# Patient Record
Sex: Male | Born: 1945 | Race: White | Hispanic: No
Health system: Southern US, Community
[De-identification: ages and names within clinical notes are randomized; demographics above are authoritative.]

---

## 2018-03-07 DIAGNOSIS — E785 Hyperlipidemia, unspecified: Secondary | ICD-10-CM | POA: Diagnosis not present

## 2018-03-07 DIAGNOSIS — K219 Gastro-esophageal reflux disease without esophagitis: Secondary | ICD-10-CM

## 2018-03-07 DIAGNOSIS — J441 Chronic obstructive pulmonary disease with (acute) exacerbation: Secondary | ICD-10-CM | POA: Diagnosis not present

## 2018-03-07 DIAGNOSIS — J440 Chronic obstructive pulmonary disease with acute lower respiratory infection: Secondary | ICD-10-CM

## 2018-03-07 DIAGNOSIS — J9611 Chronic respiratory failure with hypoxia: Secondary | ICD-10-CM | POA: Diagnosis not present

## 2018-03-08 DIAGNOSIS — E785 Hyperlipidemia, unspecified: Secondary | ICD-10-CM | POA: Diagnosis not present

## 2018-03-08 DIAGNOSIS — K219 Gastro-esophageal reflux disease without esophagitis: Secondary | ICD-10-CM | POA: Diagnosis not present

## 2018-03-08 DIAGNOSIS — J9611 Chronic respiratory failure with hypoxia: Secondary | ICD-10-CM | POA: Diagnosis not present

## 2018-03-08 DIAGNOSIS — J441 Chronic obstructive pulmonary disease with (acute) exacerbation: Secondary | ICD-10-CM | POA: Diagnosis not present

## 2018-03-09 DIAGNOSIS — J441 Chronic obstructive pulmonary disease with (acute) exacerbation: Secondary | ICD-10-CM | POA: Diagnosis not present

## 2018-03-09 DIAGNOSIS — K219 Gastro-esophageal reflux disease without esophagitis: Secondary | ICD-10-CM | POA: Diagnosis not present

## 2018-03-09 DIAGNOSIS — E785 Hyperlipidemia, unspecified: Secondary | ICD-10-CM | POA: Diagnosis not present

## 2018-03-09 DIAGNOSIS — J9611 Chronic respiratory failure with hypoxia: Secondary | ICD-10-CM | POA: Diagnosis not present

## 2018-03-10 DIAGNOSIS — J441 Chronic obstructive pulmonary disease with (acute) exacerbation: Secondary | ICD-10-CM | POA: Diagnosis not present

## 2018-03-10 DIAGNOSIS — J9611 Chronic respiratory failure with hypoxia: Secondary | ICD-10-CM | POA: Diagnosis not present

## 2018-03-10 DIAGNOSIS — K219 Gastro-esophageal reflux disease without esophagitis: Secondary | ICD-10-CM | POA: Diagnosis not present

## 2018-03-10 DIAGNOSIS — E785 Hyperlipidemia, unspecified: Secondary | ICD-10-CM | POA: Diagnosis not present

## 2018-03-11 DIAGNOSIS — E785 Hyperlipidemia, unspecified: Secondary | ICD-10-CM | POA: Diagnosis not present

## 2018-03-11 DIAGNOSIS — K219 Gastro-esophageal reflux disease without esophagitis: Secondary | ICD-10-CM | POA: Diagnosis not present

## 2018-03-11 DIAGNOSIS — J441 Chronic obstructive pulmonary disease with (acute) exacerbation: Secondary | ICD-10-CM | POA: Diagnosis not present

## 2018-03-11 DIAGNOSIS — J9611 Chronic respiratory failure with hypoxia: Secondary | ICD-10-CM | POA: Diagnosis not present

## 2018-03-12 DIAGNOSIS — K219 Gastro-esophageal reflux disease without esophagitis: Secondary | ICD-10-CM | POA: Diagnosis not present

## 2018-03-12 DIAGNOSIS — J441 Chronic obstructive pulmonary disease with (acute) exacerbation: Secondary | ICD-10-CM | POA: Diagnosis not present

## 2018-03-12 DIAGNOSIS — E785 Hyperlipidemia, unspecified: Secondary | ICD-10-CM | POA: Diagnosis not present

## 2018-03-12 DIAGNOSIS — J9611 Chronic respiratory failure with hypoxia: Secondary | ICD-10-CM | POA: Diagnosis not present

## 2018-04-07 DIAGNOSIS — E871 Hypo-osmolality and hyponatremia: Secondary | ICD-10-CM | POA: Diagnosis not present

## 2018-04-07 DIAGNOSIS — J441 Chronic obstructive pulmonary disease with (acute) exacerbation: Secondary | ICD-10-CM

## 2018-04-07 DIAGNOSIS — R0600 Dyspnea, unspecified: Secondary | ICD-10-CM

## 2018-04-08 DIAGNOSIS — J441 Chronic obstructive pulmonary disease with (acute) exacerbation: Secondary | ICD-10-CM | POA: Diagnosis not present

## 2018-04-08 DIAGNOSIS — R0600 Dyspnea, unspecified: Secondary | ICD-10-CM | POA: Diagnosis not present

## 2018-04-08 DIAGNOSIS — E871 Hypo-osmolality and hyponatremia: Secondary | ICD-10-CM | POA: Diagnosis not present

## 2018-04-09 DIAGNOSIS — R0600 Dyspnea, unspecified: Secondary | ICD-10-CM | POA: Diagnosis not present

## 2018-04-09 DIAGNOSIS — J441 Chronic obstructive pulmonary disease with (acute) exacerbation: Secondary | ICD-10-CM | POA: Diagnosis not present

## 2018-04-09 DIAGNOSIS — E871 Hypo-osmolality and hyponatremia: Secondary | ICD-10-CM | POA: Diagnosis not present

## 2018-04-25 DIAGNOSIS — R0602 Shortness of breath: Secondary | ICD-10-CM | POA: Diagnosis not present

## 2018-04-25 DIAGNOSIS — J189 Pneumonia, unspecified organism: Secondary | ICD-10-CM | POA: Diagnosis not present

## 2018-04-25 DIAGNOSIS — I361 Nonrheumatic tricuspid (valve) insufficiency: Secondary | ICD-10-CM

## 2018-04-26 DIAGNOSIS — J189 Pneumonia, unspecified organism: Secondary | ICD-10-CM | POA: Diagnosis not present

## 2018-04-26 DIAGNOSIS — R0602 Shortness of breath: Secondary | ICD-10-CM | POA: Diagnosis not present

## 2018-05-09 ENCOUNTER — Inpatient Hospital Stay (HOSPITAL_COMMUNITY): Payer: Medicare Other

## 2018-05-09 ENCOUNTER — Inpatient Hospital Stay (HOSPITAL_COMMUNITY)
Admission: AD | Admit: 2018-05-09 | Discharge: 2018-06-03 | DRG: 207 | Disposition: E | Payer: Medicare Other | Source: Other Acute Inpatient Hospital | Attending: Pulmonary Disease | Admitting: Pulmonary Disease

## 2018-05-09 DIAGNOSIS — J9690 Respiratory failure, unspecified, unspecified whether with hypoxia or hypercapnia: Secondary | ICD-10-CM | POA: Diagnosis not present

## 2018-05-09 DIAGNOSIS — Y95 Nosocomial condition: Secondary | ICD-10-CM | POA: Diagnosis not present

## 2018-05-09 DIAGNOSIS — I248 Other forms of acute ischemic heart disease: Secondary | ICD-10-CM | POA: Diagnosis not present

## 2018-05-09 DIAGNOSIS — J9621 Acute and chronic respiratory failure with hypoxia: Secondary | ICD-10-CM | POA: Diagnosis present

## 2018-05-09 DIAGNOSIS — K668 Other specified disorders of peritoneum: Secondary | ICD-10-CM | POA: Diagnosis not present

## 2018-05-09 DIAGNOSIS — R570 Cardiogenic shock: Secondary | ICD-10-CM | POA: Diagnosis not present

## 2018-05-09 DIAGNOSIS — J439 Emphysema, unspecified: Secondary | ICD-10-CM | POA: Diagnosis not present

## 2018-05-09 DIAGNOSIS — J9312 Secondary spontaneous pneumothorax: Secondary | ICD-10-CM | POA: Diagnosis not present

## 2018-05-09 DIAGNOSIS — Z66 Do not resuscitate: Secondary | ICD-10-CM | POA: Diagnosis not present

## 2018-05-09 DIAGNOSIS — E872 Acidosis: Secondary | ICD-10-CM | POA: Diagnosis not present

## 2018-05-09 DIAGNOSIS — A419 Sepsis, unspecified organism: Secondary | ICD-10-CM | POA: Diagnosis not present

## 2018-05-09 DIAGNOSIS — J9622 Acute and chronic respiratory failure with hypercapnia: Secondary | ICD-10-CM | POA: Diagnosis present

## 2018-05-09 DIAGNOSIS — I6200 Nontraumatic subdural hemorrhage, unspecified: Secondary | ICD-10-CM | POA: Diagnosis present

## 2018-05-09 DIAGNOSIS — J189 Pneumonia, unspecified organism: Secondary | ICD-10-CM | POA: Diagnosis present

## 2018-05-09 DIAGNOSIS — R6521 Severe sepsis with septic shock: Secondary | ICD-10-CM | POA: Diagnosis not present

## 2018-05-09 DIAGNOSIS — J982 Interstitial emphysema: Secondary | ICD-10-CM | POA: Diagnosis present

## 2018-05-09 DIAGNOSIS — J930 Spontaneous tension pneumothorax: Secondary | ICD-10-CM | POA: Diagnosis present

## 2018-05-09 DIAGNOSIS — N179 Acute kidney failure, unspecified: Secondary | ICD-10-CM | POA: Diagnosis not present

## 2018-05-09 DIAGNOSIS — J9601 Acute respiratory failure with hypoxia: Secondary | ICD-10-CM

## 2018-05-09 DIAGNOSIS — I469 Cardiac arrest, cause unspecified: Secondary | ICD-10-CM | POA: Diagnosis present

## 2018-05-09 DIAGNOSIS — Z6827 Body mass index (BMI) 27.0-27.9, adult: Secondary | ICD-10-CM

## 2018-05-09 DIAGNOSIS — E875 Hyperkalemia: Secondary | ICD-10-CM | POA: Diagnosis not present

## 2018-05-09 DIAGNOSIS — J939 Pneumothorax, unspecified: Secondary | ICD-10-CM

## 2018-05-09 DIAGNOSIS — R569 Unspecified convulsions: Secondary | ICD-10-CM | POA: Diagnosis not present

## 2018-05-09 DIAGNOSIS — Z9689 Presence of other specified functional implants: Secondary | ICD-10-CM

## 2018-05-09 DIAGNOSIS — Z9981 Dependence on supplemental oxygen: Secondary | ICD-10-CM | POA: Diagnosis not present

## 2018-05-09 DIAGNOSIS — G9341 Metabolic encephalopathy: Secondary | ICD-10-CM | POA: Diagnosis not present

## 2018-05-09 DIAGNOSIS — E46 Unspecified protein-calorie malnutrition: Secondary | ICD-10-CM | POA: Diagnosis not present

## 2018-05-09 DIAGNOSIS — I2510 Atherosclerotic heart disease of native coronary artery without angina pectoris: Secondary | ICD-10-CM | POA: Diagnosis present

## 2018-05-09 DIAGNOSIS — E785 Hyperlipidemia, unspecified: Secondary | ICD-10-CM | POA: Diagnosis present

## 2018-05-09 DIAGNOSIS — I700 Atherosclerosis of aorta: Secondary | ICD-10-CM | POA: Diagnosis present

## 2018-05-09 DIAGNOSIS — J860 Pyothorax with fistula: Secondary | ICD-10-CM | POA: Diagnosis not present

## 2018-05-09 DIAGNOSIS — J9383 Other pneumothorax: Secondary | ICD-10-CM

## 2018-05-09 DIAGNOSIS — J9382 Other air leak: Secondary | ICD-10-CM | POA: Diagnosis not present

## 2018-05-09 DIAGNOSIS — J9600 Acute respiratory failure, unspecified whether with hypoxia or hypercapnia: Secondary | ICD-10-CM

## 2018-05-09 DIAGNOSIS — J449 Chronic obstructive pulmonary disease, unspecified: Secondary | ICD-10-CM | POA: Diagnosis not present

## 2018-05-09 DIAGNOSIS — J95812 Postprocedural air leak: Secondary | ICD-10-CM | POA: Diagnosis not present

## 2018-05-09 LAB — CBC WITH DIFFERENTIAL/PLATELET
BASOS ABS: 0 10*3/uL (ref 0.0–0.1)
BASOS PCT: 0 %
Eosinophils Absolute: 0 10*3/uL (ref 0.0–0.5)
Eosinophils Relative: 0 %
HCT: 35 % — ABNORMAL LOW (ref 39.0–52.0)
HEMOGLOBIN: 10.5 g/dL — AB (ref 13.0–17.0)
LYMPHS PCT: 0 %
Lymphs Abs: 0 10*3/uL — ABNORMAL LOW (ref 0.7–4.0)
MCH: 28.9 pg (ref 26.0–34.0)
MCHC: 30 g/dL (ref 30.0–36.0)
MCV: 96.4 fL (ref 80.0–100.0)
Monocytes Absolute: 0 10*3/uL — ABNORMAL LOW (ref 0.1–1.0)
Monocytes Relative: 0 %
NEUTROS ABS: 36.4 10*3/uL — AB (ref 1.7–7.7)
NEUTROS PCT: 100 %
NRBC: 0 % (ref 0.0–0.2)
PLATELETS: 313 10*3/uL (ref 150–400)
RBC: 3.63 MIL/uL — AB (ref 4.22–5.81)
RDW: 13.4 % (ref 11.5–15.5)
WBC: 36.4 10*3/uL — AB (ref 4.0–10.5)
nRBC: 0 /100 WBC

## 2018-05-09 LAB — URINALYSIS, ROUTINE W REFLEX MICROSCOPIC
BACTERIA UA: NONE SEEN
BILIRUBIN URINE: NEGATIVE
Glucose, UA: 50 mg/dL — AB
Ketones, ur: NEGATIVE mg/dL
LEUKOCYTES UA: NEGATIVE
NITRITE: NEGATIVE
PH: 5 (ref 5.0–8.0)
Protein, ur: NEGATIVE mg/dL
SPECIFIC GRAVITY, URINE: 1.027 (ref 1.005–1.030)

## 2018-05-09 LAB — PROTIME-INR
INR: 1.8
PROTHROMBIN TIME: 20.7 s — AB (ref 11.4–15.2)

## 2018-05-09 LAB — MRSA PCR SCREENING: MRSA BY PCR: NEGATIVE

## 2018-05-09 LAB — GLUCOSE, CAPILLARY
GLUCOSE-CAPILLARY: 173 mg/dL — AB (ref 70–99)
Glucose-Capillary: 233 mg/dL — ABNORMAL HIGH (ref 70–99)

## 2018-05-09 LAB — LACTIC ACID, PLASMA: LACTIC ACID, VENOUS: 2.2 mmol/L — AB (ref 0.5–1.9)

## 2018-05-09 MED ORDER — ALBUTEROL (5 MG/ML) CONTINUOUS INHALATION SOLN
7.5000 mg/h | INHALATION_SOLUTION | RESPIRATORY_TRACT | Status: AC
  Filled 2018-05-09: qty 20

## 2018-05-09 MED ORDER — FENTANYL CITRATE (PF) 100 MCG/2ML IJ SOLN: 50.0000 ug | Freq: Once | INTRAMUSCULAR | Status: DC

## 2018-05-09 MED ORDER — BUDESONIDE 0.5 MG/2ML IN SUSP: 0.5000 mg | Freq: Two times a day (BID) | RESPIRATORY_TRACT | Status: DC

## 2018-05-09 MED ORDER — DOCUSATE SODIUM 50 MG/5ML PO LIQD
100.0000 mg | Freq: Two times a day (BID) | ORAL | Status: DC | PRN
  Administered 2018-05-13: 100 mg
  Filled 2018-05-09: qty 10

## 2018-05-09 MED ORDER — BISACODYL 10 MG RE SUPP: 10.0000 mg | Freq: Every day | RECTAL | Status: DC | PRN

## 2018-05-09 MED ORDER — SODIUM CHLORIDE 0.9 % IV SOLN
1.0000 g | Freq: Three times a day (TID) | INTRAVENOUS | Status: DC
  Administered 2018-05-10 – 2018-05-11 (×4): 1 g via INTRAVENOUS
  Filled 2018-05-09 (×5): qty 1

## 2018-05-09 MED ORDER — FENTANYL BOLUS VIA INFUSION
25.0000 ug | INTRAVENOUS | Status: DC | PRN
  Administered 2018-05-10 – 2018-05-18 (×41): 25 ug via INTRAVENOUS
  Filled 2018-05-09: qty 25

## 2018-05-09 MED ORDER — SODIUM CHLORIDE 0.9 % IV SOLN
2.0000 g | Freq: Once | INTRAVENOUS | Status: AC
  Administered 2018-05-09: 2 g via INTRAVENOUS
  Filled 2018-05-09: qty 2

## 2018-05-09 MED ORDER — VANCOMYCIN HCL 10 G IV SOLR
1250.0000 mg | Freq: Once | INTRAVENOUS | Status: AC
  Administered 2018-05-09: 1250 mg via INTRAVENOUS
  Filled 2018-05-09: qty 1250

## 2018-05-09 MED ORDER — FENTANYL 2500MCG IN NS 250ML (10MCG/ML) PREMIX INFUSION
25.0000 ug/h | INTRAVENOUS | Status: DC
  Administered 2018-05-10: 300 ug/h via INTRAVENOUS
  Administered 2018-05-10: 400 ug/h via INTRAVENOUS
  Administered 2018-05-10: 100 ug/h via INTRAVENOUS
  Administered 2018-05-11: 200 ug/h via INTRAVENOUS
  Administered 2018-05-11: 400 ug/h via INTRAVENOUS
  Administered 2018-05-11: 375 ug/h via INTRAVENOUS
  Administered 2018-05-12: 250 ug/h via INTRAVENOUS
  Administered 2018-05-12: 175 ug/h via INTRAVENOUS
  Administered 2018-05-13: 200 ug/h via INTRAVENOUS
  Administered 2018-05-13 – 2018-05-14 (×2): 250 ug/h via INTRAVENOUS
  Administered 2018-05-14: 220 ug/h via INTRAVENOUS
  Administered 2018-05-15: 200 ug/h via INTRAVENOUS
  Administered 2018-05-15: 250 ug/h via INTRAVENOUS
  Administered 2018-05-16: 225 ug/h via INTRAVENOUS
  Administered 2018-05-16 (×2): 250 ug/h via INTRAVENOUS
  Administered 2018-05-17: 200 ug/h via INTRAVENOUS
  Filled 2018-05-09 (×19): qty 250

## 2018-05-09 MED ORDER — ARFORMOTEROL TARTRATE 15 MCG/2ML IN NEBU
15.0000 ug | INHALATION_SOLUTION | Freq: Two times a day (BID) | RESPIRATORY_TRACT | Status: DC
  Administered 2018-05-09 – 2018-05-17 (×16): 15 ug via RESPIRATORY_TRACT
  Filled 2018-05-09 (×16): qty 2

## 2018-05-09 MED ORDER — FAMOTIDINE IN NACL 20-0.9 MG/50ML-% IV SOLN
20.0000 mg | Freq: Two times a day (BID) | INTRAVENOUS | Status: DC
  Administered 2018-05-09 – 2018-05-17 (×16): 20 mg via INTRAVENOUS
  Filled 2018-05-09 (×16): qty 50

## 2018-05-09 MED ORDER — MIDAZOLAM HCL 2 MG/2ML IJ SOLN
1.0000 mg | INTRAMUSCULAR | Status: DC | PRN
  Administered 2018-05-10 – 2018-05-16 (×20): 1 mg via INTRAVENOUS
  Filled 2018-05-09 (×18): qty 2

## 2018-05-09 MED ORDER — VANCOMYCIN HCL IN DEXTROSE 750-5 MG/150ML-% IV SOLN
750.0000 mg | Freq: Three times a day (TID) | INTRAVENOUS | Status: DC
  Administered 2018-05-10: 750 mg via INTRAVENOUS
  Filled 2018-05-09 (×2): qty 150

## 2018-05-09 MED ORDER — MIDAZOLAM HCL 2 MG/2ML IJ SOLN
1.0000 mg | INTRAMUSCULAR | Status: AC | PRN
  Administered 2018-05-11 – 2018-05-13 (×3): 1 mg via INTRAVENOUS
  Filled 2018-05-09 (×8): qty 2

## 2018-05-09 MED ORDER — VASOPRESSIN 20 UNIT/ML IV SOLN
0.0300 [IU]/min | INTRAVENOUS | Status: DC
  Administered 2018-05-09 – 2018-05-10 (×2): 0.03 [IU]/min via INTRAVENOUS
  Filled 2018-05-09 (×3): qty 2

## 2018-05-09 MED ORDER — METRONIDAZOLE IN NACL 5-0.79 MG/ML-% IV SOLN
500.0000 mg | Freq: Three times a day (TID) | INTRAVENOUS | Status: DC
  Administered 2018-05-09 – 2018-05-14 (×15): 500 mg via INTRAVENOUS
  Filled 2018-05-09 (×15): qty 100

## 2018-05-09 MED ORDER — NOREPINEPHRINE 4 MG/250ML-% IV SOLN
0.0000 ug/min | INTRAVENOUS | Status: DC
  Administered 2018-05-09: 30 ug/min via INTRAVENOUS
  Administered 2018-05-09: 18 ug/min via INTRAVENOUS
  Administered 2018-05-10: 16 ug/min via INTRAVENOUS
  Filled 2018-05-09 (×4): qty 250

## 2018-05-09 MED ORDER — IPRATROPIUM-ALBUTEROL 0.5-2.5 (3) MG/3ML IN SOLN
3.0000 mL | RESPIRATORY_TRACT | Status: DC
  Administered 2018-05-09 – 2018-05-18 (×54): 3 mL via RESPIRATORY_TRACT
  Filled 2018-05-09 (×55): qty 3

## 2018-05-09 MED ORDER — BUDESONIDE 0.5 MG/2ML IN SUSP
0.5000 mg | Freq: Two times a day (BID) | RESPIRATORY_TRACT | Status: DC
  Administered 2018-05-09 – 2018-05-18 (×18): 0.5 mg via RESPIRATORY_TRACT
  Filled 2018-05-09 (×18): qty 2

## 2018-05-09 MED ORDER — METHYLPREDNISOLONE SODIUM SUCC 40 MG IJ SOLR
40.0000 mg | Freq: Four times a day (QID) | INTRAMUSCULAR | Status: DC
  Administered 2018-05-09 – 2018-05-12 (×11): 40 mg via INTRAVENOUS
  Filled 2018-05-09 (×12): qty 1

## 2018-05-09 MED ORDER — INSULIN ASPART 100 UNIT/ML ~~LOC~~ SOLN
0.0000 [IU] | SUBCUTANEOUS | Status: DC
  Administered 2018-05-09 – 2018-05-10 (×3): 2 [IU] via SUBCUTANEOUS
  Administered 2018-05-10: 3 [IU] via SUBCUTANEOUS
  Administered 2018-05-10: 2 [IU] via SUBCUTANEOUS
  Administered 2018-05-11: 1 [IU] via SUBCUTANEOUS
  Administered 2018-05-11: 2 [IU] via SUBCUTANEOUS
  Administered 2018-05-11 – 2018-05-12 (×3): 1 [IU] via SUBCUTANEOUS
  Administered 2018-05-12: 2 [IU] via SUBCUTANEOUS
  Administered 2018-05-12 (×2): 1 [IU] via SUBCUTANEOUS
  Administered 2018-05-13 (×3): 2 [IU] via SUBCUTANEOUS
  Administered 2018-05-13: 1 [IU] via SUBCUTANEOUS
  Administered 2018-05-13 (×2): 2 [IU] via SUBCUTANEOUS
  Administered 2018-05-13: 1 [IU] via SUBCUTANEOUS
  Administered 2018-05-14 (×3): 2 [IU] via SUBCUTANEOUS
  Administered 2018-05-14: 1 [IU] via SUBCUTANEOUS
  Administered 2018-05-14: 2 [IU] via SUBCUTANEOUS
  Administered 2018-05-15: 1 [IU] via SUBCUTANEOUS
  Administered 2018-05-15 (×2): 2 [IU] via SUBCUTANEOUS
  Administered 2018-05-15: 1 [IU] via SUBCUTANEOUS
  Administered 2018-05-15 – 2018-05-16 (×3): 2 [IU] via SUBCUTANEOUS
  Administered 2018-05-16 – 2018-05-17 (×6): 1 [IU] via SUBCUTANEOUS

## 2018-05-09 NOTE — Progress Notes (Signed)
Back from CT w/o complications 

## 2018-05-09 NOTE — H&P (Signed)
NAME:  Julian Fernandez, MRN:  [id number], DOB:  [birth year], LOS: 0 ADMISSION DATE:  2018-06-05, CONSULTATION DATE:  06/05/18 REFERRING MD:  [facility], CHIEF COMPLAINT:  Cardiac arrest   Brief History   21 yoM with PMH significant for chronic hypoxic respiratory failure on 4L O2 and COPD presented to [facility] from [place] with SOB and AMS.  Found to have left tension pneumothorax.  Had 12 min cardiac arrest with intubation and CT placement.  Pt with progressive shock and new mass pneumoperitoneum.  Recent hospitalization 2 weeks ago for AECOPD and HCAP.   Past Medical History  COPD, chronic hypoxic respiratory failure on 4L O2, HLD,  Significant Hospital Events   11/6 tx to [facility] from [place]/ cardiac arrest   Consults: date of consult/date signed off & final recs:   Procedures (surgical and bedside):  11/6 ETT >> 11/6 Left 30 f CT >> 11/6 L IJ TL >> 11/6 foley >>  Significant Diagnostic Tests:  11/6 CT chest >> Left CT in good position with residual 25% pnx.  Massive pneumoperitoneum, most likely dissected own from the pneumothorax.  There is also extensive aire in the abdominal wall muscles.  No free fluid or obvious bowel injury.  Severe emphysema.  Bilateral pleural effusions.    Micro Data:  11/6 BC x 2 >> 11/6 trach asp >>  Antimicrobials:  11/6 vanc >> 11/6 cefepime >> 11/6 Flagyl >>  Subjective:  Arrived on levophed 30 mcg, vasopressin, fentanyl 100 mcg/hr and versed at 1 mg/hr.  Informed by transport team of unequal pupils.   Objective   Temperature (!) 97.4 F (36.3 C), temperature source Oral, height 5\' 8"  (1.727 m), SpO2 94 %.    Vent Mode: PRVC FiO2 (%):  [100 %] 100 % Set Rate:  [16 bmp-20 bmp] 16 bmp Vt Set:  [410 mL] 410 mL PEEP:  [5 cmH20] 5 cmH20 Plateau Pressure:  [34 cmH20] 34 cmH20   Intake/Output Summary (Last 24 hours) at 2018-06-05 1904 Last data filed at 06-05-18 1858 Gross per 24 hour  Intake -  Output 200 ml  Net -200 ml   There were no  vitals filed for this visit.  Examination: General:  Critically ill thin male on MV HEENT: pupils R 5 / L 3 minimally reactive, ETT/ OGT, +JVD  Neuro: on fentanyl, minimal grimace/ withdrawal of of shoulders with noxius stimuli, no movement in lower extremities noted CV: RR, distant HS PULM: initially breath stacking on exam, TV taken down to 6 ml/kg, rr adjusted for ME with improvement.  Bilateral rhonchi, diminished in left, CT noted to left midax with 2-3/7 air leak and 200 ml of serosanguinous drainage on 20 cm of sxn.  Palpable SQ air to left chest and upper abd, no secretions on suctioning  OZ:HYQM, hypo BS, foley in place with clear yellow urine Extremities: cold/ mottled, no LE edema  Skin: no rashes  Officer remains at bedside.   Resolved Hospital Problem list    Assessment & Plan:  Acute on chronic hypoxic respiratory failure - multifactorial related to Left tension pneumothorax, AECOPD, +/- HCAP, cardiac arrest  Vent dyssynchrony  Pneumoperitoneum - unclear etiology- ? Tracking from ptx  - s/p left CT at OSH - question if left pneumothorax was related to bleb rupture given severe emphysema  P:  Full MV support- PRVC 6 cc/kg, monitor for auto-peeping ABG now CXR now and in AM Will repeat chest CT and abd CT to 20 cm suction PAD protocol with fentanyl and  prn versed- for vent synchrony at this time  duonebs q 4, pulmicort and brovanna nebs  Solumedrol 40mg  q 6   Cardiac Arrest presumed due to left tension pneumothorax but can not rule out other etiologies at this time Shock- septic vs cardiogenic  Elevated troponin without acute ST changes  - 12 mins prior to ROSC and was f/c at [facility], sedated for vent compliance P:  Defer TTM Continue levophed and vasopressin for MAP goal > 65 Arrived with left radial aline but is not working, will need replace Trend EKG, troponin, and lactate Check co-ox CVP q 4 Check cortisol  Recheck CMET, CBC, coags now   Acute  encephalopathy after cardiac arrest and ? New unequal pupils  P:  Stat Head CT after CXR Would ideally like to stop sedation to allow for neuro exam, however patient requires ongoing sedation for vent synchrony Hourly neuro exams   Leukocytosis with recent tx for HCAP P:  Sending BC, trach asp now Check PCT Empiric vanc, cefepime and flagyl for now Trend WBC/ fever curve   Disposition / Summary of Today's Plan 05/07/2018   BP stabilization and sedation for vent synchrony CT head and chest when able    Diet: NPO Pain/Anxiety/Delirium protocol (if indicated): fentanyl gtt, prn versed for vent synchrony VAP protocol (if indicated): yes DVT prophylaxis: SCDs only for now pending head CT  GI prophylaxis: pepcid Hyperglycemia protocol: SSI  Mobility: bed rest  Code Status: Full  Family Communication: no family at bedside.  Officer remains at bedside.   Labs   CBC: No results for input(s): WBC, NEUTROABS, HGB, HCT, MCV, PLT in the last 168 hours.  Basic Metabolic Panel: No results for input(s): NA, K, CL, CO2, GLUCOSE, BUN, CREATININE, CALCIUM, MG, PHOS in the last 168 hours. GFR: CrCl cannot be calculated (No successful lab value found.). No results for input(s): PROCALCITON, WBC, LATICACIDVEN in the last 168 hours.  Liver Function Tests: No results for input(s): AST, ALT, ALKPHOS, BILITOT, PROT, ALBUMIN in the last 168 hours. No results for input(s): LIPASE, AMYLASE in the last 168 hours. No results for input(s): AMMONIA in the last 168 hours.  ABG No results found for: PHART, PCO2ART, PO2ART, HCO3, TCO2, ACIDBASEDEF, O2SAT   Coagulation Profile: No results for input(s): INR, PROTIME in the last 168 hours.  Cardiac Enzymes: No results for input(s): CKTOTAL, CKMB, CKMBINDEX, TROPONINI in the last 168 hours.  HbA1C: No results found for: HGBA1C  CBG: No results for input(s): GLUCAP in the last 168 hours.  Admitting History of Present Illness.   5 yoM with PMH  significant for chronic hypoxic respiratory failure on 4L O2 and COPD who presented to [facility] ER from jail with acute shortness of breath and altered mental status.   Recently admitted at [facility] 2 weeks ago for hypoxic respiratory failure secondary to AECOPD and HCAP.  On arrival, found to have absent left sided breath sounds, CXR confirming Left pneumothorax.  Chest tube was placed and patient with ongoing hypotension.  Patient with cardiac arrest for ~12 mins with ROSC, when intubated.  Reported that patient was following commands post arrest.  Since has had ongoing progressive shock.  CT of chest show some residual left pneumothorax with CT in place and large pneumoperitoneum thought to be dissected down from pneumothorax.  Initial labs in ER noted for WBC 17, Hgb 14.7, K 5.1, sCr 0.7, trop 1.16.  He was given 3L NS and zosyn.  Patient transferred to [facility] for further care  by PCCM and available TCTS services if needed.  Review of Systems:   Unable  Past Medical History  He,  has no past medical history on file.   Surgical History   Unable   Social History   Social History   Socioeconomic History  . Marital status: Unknown    Spouse name: Not on file  . Number of children: Not on file  . Years of education: Not on file  . Highest education level: Not on file  Occupational History  . Not on file  Social Needs  . Financial resource strain: Not on file  . Food insecurity:    Worry: Not on file    Inability: Not on file  . Transportation needs:    Medical: Not on file    Non-medical: Not on file  Tobacco Use  . Smoking status: Not on file  Substance and Sexual Activity  . Alcohol use: Not on file  . Drug use: Not on file  . Sexual activity: Not on file  Lifestyle  . Physical activity:    Days per week: Not on file    Minutes per session: Not on file  . Stress: Not on file  Relationships  . Social connections:    Talks on phone: Not on file    Gets together:  Not on file    Attends religious service: Not on file    Active member of club or organization: Not on file    Attends meetings of clubs or organizations: Not on file    Relationship status: Not on file  . Intimate partner violence:    Fear of current or ex partner: Not on file    Emotionally abused: Not on file    Physically abused: Not on file    Forced sexual activity: Not on file  Other Topics Concern  . Not on file  Social History Narrative  . Not on file  ,     Family History   His family history is not on file.   Allergies Allergies not on file   Home Medications  Prior to Admission medications   Not on File     Critical care time: 60 mins   Posey Boyer, AGACNP-BC [facility] Pgr: [phone] or if no answer [phone] 2018-05-22, 7:04 PM

## 2018-05-09 NOTE — Procedures (Signed)
Arterial Catheter Insertion Procedure Note Julian Fernandez [id number] Mar 15, 1946  Procedure: Insertion of Arterial Catheter  Indications: Blood pressure monitoring and Frequent blood sampling  Procedure Details Consent: Unable to obtain consent because of altered level of consciousness. Time Out: Verified patient identification, verified procedure, site/side was marked, verified correct patient position, special equipment/implants available, medications/allergies/relevent history reviewed, required imaging and test results available.  Performed  Maximum sterile technique was used including antiseptics, cap, gloves, gown, hand hygiene, mask and sheet. Skin prep: Chlorhexidine; local anesthetic administered 20 gauge catheter was inserted into right femoral artery using the Seldinger technique. ULTRASOUND GUIDANCE USED: YES Evaluation Blood flow good; BP tracing good. Complications: No apparent complications.  Procedure performed under direct ultrasound guidance for real time vessel cannulation.      Rutherford Guys, [place] - C [place] [facility] Pager: [phone]  or [phone] 05/31/18, 8:11 PM

## 2018-05-09 NOTE — Progress Notes (Signed)
eLink Physician-Brief Progress Note Patient Name: Julian Fernandez DOB: 1945-09-14 MRN: [id number]   Date of Service  05-30-18  HPI/Events of Note  I WAS CALLED BY RADIOLOGIST CT HEAD SHOWS VERY SMALL 3 MM SUBDURAL HEMATOMA  eICU Interventions  RECOMMEND FOLLOWING CLINICALLY MAY CONSIDER  NEURO SURG RECS IF NEEDED STOP PLT AGENTS AND DVT PRX IF ORDERED     Intervention Category Minor Interventions: Other:  Erin Fulling 05/30/2018, 11:43 PM

## 2018-05-09 NOTE — Progress Notes (Signed)
Pharmacy Antibiotic Note  Julian Fernandez is a 72 y.o. male admitted to [facility] on May 10, 2018 with SOB and AMS.  Found to have left pneumothorax and then suffered a cardiac arrest. Patient then transferred to [facility].  Pharmacy has been consulted for vancomycin and cefepime dosing for sepsis.  He is also started on Flagyl.  Today's CMP hasn't resulted.  WBC elevated at 36.4.  Information from [facility]: 150 lbs = 68 kg Zosyn 4.5gm at 1241 SCr 0.7, CrCL 90 ml/min   Plan: Vanc 1250mg  IV x 1, then 750mg  IV Q8H Cefepime 2gm IV x 1, then 1gm IV Q8H Flagyl 500mg  IV Q8H per MD Monitor renal fxn, clinical progress, vanc trough at [facility]   Height: 5\' 8"  (172.7 cm) IBW/kg (Calculated) : 68.4  Temp (24hrs), Avg:97.4 F (36.3 C), Min:97.4 F (36.3 C), Max:97.4 F (36.3 C)  No results for input(s): WBC, CREATININE, LATICACIDVEN, VANCOTROUGH, VANCOPEAK, VANCORANDOM, GENTTROUGH, GENTPEAK, GENTRANDOM, TOBRATROUGH, TOBRAPEAK, TOBRARND, AMIKACINPEAK, AMIKACINTROU, AMIKACIN in the last 168 hours.  CrCl cannot be calculated (No successful lab value found.).    Allergies not on file   Vanc 11/6 >> Cefepime 11/6 >> Flagyl 11/6 >>  11/6 MRSA PCR - 11/6 BCx -  11/6 TA -    Lazarius Rivkin D. Laney Potash, PharmD, BCPS, BCCCP 05-10-18, 10:04 PM

## 2018-05-09 NOTE — H&P (Signed)
Attending:    Subjective: 72 year old male with COPD on oxygen brought from [facility] today with severe shortness of breath.  Found to have a pneumothorax.  Required intubation.  Shortly after but intubation had a cardiac arrest.  Chest tube was placed.  Pulmonary critical care medicine was called for admission.  He was transferred to our facility.  While waiting in the emergency room he developed worsening shock.  Objective: Vitals:   05/07/2018 1738 05/04/2018 1827 05/23/2018 1856  Temp:  (!) 97.4 F (36.3 C)   TempSrc:  Oral   SpO2:   94%  Height: 5\' 8"  (1.727 m)     Vent Mode: PRVC FiO2 (%):  [100 %] 100 % Set Rate:  [16 bmp-20 bmp] 16 bmp Vt Set:  [410 mL] 410 mL PEEP:  [5 cmH20] 5 cmH20 Plateau Pressure:  [34 cmH20] 34 cmH20  Intake/Output Summary (Last 24 hours) at 05/12/2018 1902 Last data filed at 05/05/2018 1858 Gross per 24 hour  Intake -  Output 200 ml  Net -200 ml    General:  In bed on vent HENT: NCAT ETT in place PULM: Poor air movement bilaterally B, vent supported breathing CV: RRR, no mgr GI: BS+, soft, nontender MSK: normal bulk and tone Neuro: sedated on vent Derm: no crepitance noted     CBC No results found for: WBC, RBC, HGB, HCT, PLT, MCV, MCH, MCHC, RDW, LYMPHSABS, MONOABS, EOSABS, BASOSABS  BMET No results found for: NA, K, CL, CO2, GLUCOSE, BUN, CREATININE, CALCIUM, GFRNONAA, GFRAA  CXR images personally reviewed, chest tube in place, emphysema noted, question continuous diaphragm side, cardiac silhouette within normal limits  Impression/Plan: Acute on chronic respiratory failure with hypoxemia due to COPD exacerbation now with severe air trapping: Continuous albuterol now Sedation titrated to a RA SS score of -3 Brovana twice a day Pulmicort twice a day Solu-Medrol 40 mg IV every 6  Pneumothorax on left CT chest now to evaluate for potential ongoing pneumothorax  Pneumomediastinum: Likely due to air tracking down through  diaphragm: Repeat CT abdomen now given worsening shock  Worsening shock: Suspect auto PEEP/cardiogenic shock Treat bronchospasm now Decreased respiratory rate now Permissive hypercarbia Continue levo fed and vasopressin as needed maintain mean arterial pressure greater than 65 Repeat lactic acid CBC  Encephalopathy after cardiac arrest: At this point need to maintain heavy sedation for ventilator synchrony Daily wake up assessment and holiday Not a candidate for targeted temperature management as he was reported to be following commands and is now in severe shock  Rest as per NP note  My cc time 45 minutes  Heber [place], MD [facility] [facility] Pager: [phone] Cell: [phone] After 3pm or if no response, call [phone]

## 2018-05-10 ENCOUNTER — Inpatient Hospital Stay (HOSPITAL_COMMUNITY): Payer: Medicare Other

## 2018-05-10 DIAGNOSIS — I469 Cardiac arrest, cause unspecified: Secondary | ICD-10-CM

## 2018-05-10 LAB — COMPREHENSIVE METABOLIC PANEL
ALK PHOS: 39 U/L (ref 38–126)
ALT: 87 U/L — ABNORMAL HIGH (ref 0–44)
ANION GAP: 6 (ref 5–15)
AST: 98 U/L — ABNORMAL HIGH (ref 15–41)
Albumin: 2.4 g/dL — ABNORMAL LOW (ref 3.5–5.0)
BILIRUBIN TOTAL: 0.6 mg/dL (ref 0.3–1.2)
BUN: 15 mg/dL (ref 8–23)
CALCIUM: 6.2 mg/dL — AB (ref 8.9–10.3)
CO2: 19 mmol/L — ABNORMAL LOW (ref 22–32)
Chloride: 109 mmol/L (ref 98–111)
Creatinine, Ser: 1.03 mg/dL (ref 0.61–1.24)
GFR calc Af Amer: >60 mL/min (ref >60)
Glucose, Bld: 243 mg/dL — ABNORMAL HIGH (ref 70–99)
Potassium: 5.6 mmol/L — ABNORMAL HIGH (ref 3.5–5.1)
Sodium: 134 mmol/L — ABNORMAL LOW (ref 135–145)
TOTAL PROTEIN: 4.5 g/dL — AB (ref 6.5–8.1)

## 2018-05-10 LAB — POCT I-STAT 3, ART BLOOD GAS (G3+)
ACID-BASE DEFICIT: 11 mmol/L — AB (ref 0.0–2.0)
Bicarbonate: 20.9 mmol/L (ref 20.0–28.0)
O2 Saturation: 87 %
PCO2 ART: 72.8 mmHg — AB (ref 32.0–48.0)
PO2 ART: 75 mmHg — AB (ref 83.0–108.0)
TCO2: 23 mmol/L (ref 22–32)
pH, Arterial: 7.067 — CL (ref 7.350–7.450)

## 2018-05-10 LAB — BLOOD GAS, ARTERIAL
ACID-BASE DEFICIT: 8.3 mmol/L — AB (ref 0.0–2.0)
Acid-base deficit: 7.8 mmol/L — ABNORMAL HIGH (ref 0.0–2.0)
BICARBONATE: 19.4 mmol/L — AB (ref 20.0–28.0)
Bicarbonate: 20.8 mmol/L (ref 20.0–28.0)
DRAWN BY: 44166
Drawn by: 213381
FIO2: 100
FIO2: 80
LHR: 34 {breaths}/min
O2 Saturation: 96.6 %
O2 Saturation: 97 %
PEEP: 5 cmH2O
PEEP: 5 cmH2O
Patient temperature: 98.4
Patient temperature: 100.8
RATE: 14 resp/min
VT: 410 mL
VT: 410 mL
pCO2 arterial: 73.1 mmHg (ref 32.0–48.0)
pCO2 arterial: 63.5 mmHg — ABNORMAL HIGH (ref 32.0–48.0)
pH, Arterial: 7.081 — CL (ref 7.350–7.450)
pH, Arterial: 7.12 — CL (ref 7.350–7.450)
pO2, Arterial: 100 mmHg (ref 83.0–108.0)
pO2, Arterial: 112 mmHg — ABNORMAL HIGH (ref 83.0–108.0)

## 2018-05-10 LAB — PHOSPHORUS
PHOSPHORUS: 3.2 mg/dL (ref 2.5–4.6)
PHOSPHORUS: 3.3 mg/dL (ref 2.5–4.6)

## 2018-05-10 LAB — ECHOCARDIOGRAM LIMITED
HEIGHTINCHES: 68 in
Weight: 2409.19 oz

## 2018-05-10 LAB — BASIC METABOLIC PANEL
Anion gap: 5 (ref 5–15)
Anion gap: 3 — ABNORMAL LOW (ref 5–15)
BUN: 15 mg/dL (ref 8–23)
BUN: 17 mg/dL (ref 8–23)
CALCIUM: 5.9 mg/dL — AB (ref 8.9–10.3)
CHLORIDE: 112 mmol/L — AB (ref 98–111)
CO2: 18 mmol/L — AB (ref 22–32)
CO2: 21 mmol/L — AB (ref 22–32)
CREATININE: 1.03 mg/dL (ref 0.61–1.24)
CREATININE: 1.15 mg/dL (ref 0.61–1.24)
Calcium: 6.3 mg/dL — CL (ref 8.9–10.3)
Chloride: 113 mmol/L — ABNORMAL HIGH (ref 98–111)
GFR calc Af Amer: >60 mL/min (ref >60)
GFR calc non Af Amer: >60 mL/min (ref >60)
GFR calc non Af Amer: >60 mL/min (ref >60)
GLUCOSE: 224 mg/dL — AB (ref 70–99)
GLUCOSE: 191 mg/dL — AB (ref 70–99)
Potassium: 5.6 mmol/L — ABNORMAL HIGH (ref 3.5–5.1)
Potassium: 4.9 mmol/L (ref 3.5–5.1)
Sodium: 135 mmol/L (ref 135–145)
Sodium: 137 mmol/L (ref 135–145)

## 2018-05-10 LAB — MAGNESIUM
MAGNESIUM: 1.8 mg/dL (ref 1.7–2.4)
Magnesium: 1.7 mg/dL (ref 1.7–2.4)

## 2018-05-10 LAB — CORTISOL: CORTISOL PLASMA: 33.8 ug/dL

## 2018-05-10 LAB — GLUCOSE, CAPILLARY
GLUCOSE-CAPILLARY: 110 mg/dL — AB (ref 70–99)
Glucose-Capillary: 193 mg/dL — ABNORMAL HIGH (ref 70–99)
Glucose-Capillary: 171 mg/dL — ABNORMAL HIGH (ref 70–99)
Glucose-Capillary: 162 mg/dL — ABNORMAL HIGH (ref 70–99)
Glucose-Capillary: 75 mg/dL (ref 70–99)
Glucose-Capillary: 138 mg/dL — ABNORMAL HIGH (ref 70–99)

## 2018-05-10 LAB — LACTIC ACID, PLASMA
Lactic Acid, Venous: 5.1 mmol/L (ref 0.5–1.9)
Lactic Acid, Venous: 4.2 mmol/L (ref 0.5–1.9)

## 2018-05-10 LAB — CBC
HCT: 43.1 % (ref 39.0–52.0)
Hemoglobin: 12.9 g/dL — ABNORMAL LOW (ref 13.0–17.0)
MCH: 28.8 pg (ref 26.0–34.0)
MCHC: 29.9 g/dL — ABNORMAL LOW (ref 30.0–36.0)
MCV: 96.2 fL (ref 80.0–100.0)
NRBC: 0 % (ref 0.0–0.2)
PLATELETS: 318 10*3/uL (ref 150–400)
RBC: 4.48 MIL/uL (ref 4.22–5.81)
RDW: 13.9 % (ref 11.5–15.5)
WBC: 32.5 10*3/uL — ABNORMAL HIGH (ref 4.0–10.5)

## 2018-05-10 LAB — TROPONIN I
TROPONIN I: 1.09 ng/mL — AB (ref <0.03)
Troponin I: 1.37 ng/mL (ref <0.03)

## 2018-05-10 LAB — POTASSIUM
POTASSIUM: 5.1 mmol/L (ref 3.5–5.1)
Potassium: 5 mmol/L (ref 3.5–5.1)

## 2018-05-10 LAB — PROCALCITONIN: PROCALCITONIN: 8.32 ng/mL

## 2018-05-10 LAB — OSMOLALITY, URINE: Osmolality, Ur: 477 mOsm/kg (ref 300–900)

## 2018-05-10 MED ORDER — CALCIUM GLUCONATE-NACL 2-0.675 GM/100ML-% IV SOLN
2.0000 g | Freq: Once | INTRAVENOUS | Status: AC
  Administered 2018-05-10: 2000 mg via INTRAVENOUS
  Filled 2018-05-10: qty 100

## 2018-05-10 MED ORDER — PERFLUTREN LIPID MICROSPHERE
INTRAVENOUS | Status: AC
  Filled 2018-05-10: qty 10

## 2018-05-10 MED ORDER — LACTATED RINGERS IV SOLN
INTRAVENOUS | Status: DC
  Administered 2018-05-10 – 2018-05-13 (×4): via INTRAVENOUS

## 2018-05-10 MED ORDER — LACTATED RINGERS IV SOLN: INTRAVENOUS | Status: DC

## 2018-05-10 MED ORDER — LACTATED RINGERS IV BOLUS
1000.0000 mL | Freq: Once | INTRAVENOUS | Status: AC
  Administered 2018-05-10: 1000 mL via INTRAVENOUS

## 2018-05-10 MED ORDER — CHLORHEXIDINE GLUCONATE 0.12% ORAL RINSE (MEDLINE KIT)
15.0000 mL | Freq: Two times a day (BID) | OROMUCOSAL | Status: DC
  Administered 2018-05-10 – 2018-05-18 (×15): 15 mL via OROMUCOSAL

## 2018-05-10 MED ORDER — ORAL CARE MOUTH RINSE
15.0000 mL | OROMUCOSAL | Status: DC
  Administered 2018-05-10 – 2018-05-18 (×76): 15 mL via OROMUCOSAL

## 2018-05-10 MED ORDER — VANCOMYCIN HCL IN DEXTROSE 750-5 MG/150ML-% IV SOLN
750.0000 mg | Freq: Two times a day (BID) | INTRAVENOUS | Status: DC
  Administered 2018-05-10 – 2018-05-12 (×4): 750 mg via INTRAVENOUS
  Filled 2018-05-10 (×5): qty 150

## 2018-05-10 MED ORDER — SODIUM CHLORIDE 0.9 % IV SOLN
INTRAVENOUS | Status: DC | PRN
  Administered 2018-05-10 – 2018-05-13 (×3): 250 mL via INTRAVENOUS

## 2018-05-10 MED ORDER — SODIUM CHLORIDE 0.9 % IV BOLUS: 500.0000 mL | Freq: Once | INTRAVENOUS | Status: DC

## 2018-05-10 MED ORDER — FUROSEMIDE 10 MG/ML IJ SOLN
40.0000 mg | Freq: Once | INTRAMUSCULAR | Status: AC
  Administered 2018-05-10: 40 mg via INTRAVENOUS
  Filled 2018-05-10: qty 4

## 2018-05-10 MED ORDER — LACTATED RINGERS IV BOLUS: 1000.0000 mL | Freq: Once | INTRAVENOUS | Status: DC

## 2018-05-10 MED ORDER — SODIUM CHLORIDE 0.9 % IV SOLN
INTRAVENOUS | Status: DC | PRN
  Administered 2018-05-10 – 2018-05-11 (×2): 1000 mL via INTRAVENOUS

## 2018-05-10 MED ORDER — PERFLUTREN LIPID MICROSPHERE
4.0000 mL | Freq: Once | INTRAVENOUS | Status: AC
  Administered 2018-05-10: 4 mL via INTRAVENOUS

## 2018-05-10 MED ORDER — NOREPINEPHRINE 16 MG/250ML-% IV SOLN
0.0000 ug/min | INTRAVENOUS | Status: DC
  Administered 2018-05-10: 27 ug/min via INTRAVENOUS
  Administered 2018-05-10: 16 ug/min via INTRAVENOUS
  Administered 2018-05-11: 12 ug/min via INTRAVENOUS
  Administered 2018-05-11: 2 ug/min via INTRAVENOUS
  Filled 2018-05-10 (×3): qty 250

## 2018-05-10 MED ORDER — SODIUM CHLORIDE 0.9 % IV BOLUS
1000.0000 mL | Freq: Once | INTRAVENOUS | Status: AC
  Administered 2018-05-10: 1000 mL via INTRAVENOUS

## 2018-05-10 NOTE — Progress Notes (Signed)
Unable to draw back on femoral arterial line, provider made aware.

## 2018-05-10 NOTE — Progress Notes (Signed)
  Echocardiogram 2D Echocardiogram has been performed.  Julian Fernandez 05/10/2018, 11:51 AM

## 2018-05-10 NOTE — Progress Notes (Signed)
NAME:  Julian Fernandez, MRN:  [id number], DOB:  1945/09/30, LOS: 1 ADMISSION DATE:  05-22-18, CONSULTATION DATE:  05-22-18 REFERRING MD:  [facility], CHIEF COMPLAINT:  Cardiac arrest   Brief History   72 yoM with PMH significant for chronic hypoxic respiratory failure on 4L O2 and COPD presented to [facility] from [place] with SOB and AMS.  Found to have left tension pneumothorax.  Had 12 min cardiac arrest with intubation and CT placement.  Pt with progressive shock and new mass pneumoperitoneum.  Recent hospitalization 2 weeks ago for AECOPD and HCAP.   Past Medical History  COPD, chronic hypoxic respiratory failure on 4L O2, HLD,  Significant Hospital Events   11/6 tx to [facility] from / cardiac arrest   Consults: date of consult/date signed off & final recs:   Procedures (surgical and bedside):  11/6 ETT >> 11/6 Left 30 f CT >> 11/6 L IJ TL >> 11/6 foley >>  Significant Diagnostic Tests:  11/6 CT chest >> Left CT in good position with residual 25% pnx.  Massive pneumoperitoneum, most likely dissected own from the pneumothorax.  There is also extensive aire in the abdominal wall muscles.  No free fluid or obvious bowel injury.  Severe emphysema.  Bilateral pleural effusions.    Micro Data:  11/6 BC x 2 >> 11/6 trach asp >>  Antimicrobials:  11/6 vanc >> 11/6 cefepime >> 11/6 Flagyl >>  Subjective:  Currently on vasopressor support.  Noted to have a small subdural hematoma on CT of the head. Objective   Blood pressure (!) 85/57, pulse 88, temperature 99.3 F (37.4 C), resp. rate (!) 24, height 5\' 8"  (1.727 m), weight 68.3 kg, SpO2 96 %. CVP:  [7 mmHg-31 mmHg] 8 mmHg  Vent Mode: PRVC FiO2 (%):  [80 %-100 %] 80 % Set Rate:  [14 bmp-24 bmp] 24 bmp Vt Set:  [410 mL] 410 mL PEEP:  [5 cmH20] 5 cmH20 Plateau Pressure:  [18 cmH20-34 cmH20] 18 cmH20   Intake/Output Summary (Last 24 hours) at 05/10/2018 1208 Last data filed at 05/10/2018 0900 Gross per 24 hour  Intake 2195.13  ml  Output 1275 ml  Net 920.13 ml   Filed Weights   05/10/18 0500  Weight: 68.3 kg    Examination: General: Elderly male who is handcuffed to the bed sedated and he withdraws bilaterally but does not follow commands HEENT: Tracheal tube in place Neuro: Withdraws is to be gravitate to the right side CV: Distant PULM: Chest tube without air leak, intubated on full mechanical ventilatory support WU:[id number], non-tender, bsx4 active  Extremities: warm/dry, 1+ edema  Skin: no rashes or lesions  Officer remains at bedside.   Resolved Hospital Problem list    Assessment & Plan:  Acute on chronic hypoxic respiratory failure - multifactorial related to Left tension pneumothorax, AECOPD, +/- HCAP, cardiac arrest  Vent dyssynchrony  Pneumoperitoneum - unclear etiology- ? Tracking from ptx  - s/p left CT at OSH - question if left pneumothorax was related to bleb rupture given severe emphysema  P:  Continue full mechanical dilatory support Repeat arterial blood gas X-ray shows almost complete resolution of pneumothorax CT to 20 cm suction Solu-Medrol with history of CPAP COPD  Cardiac Arrest presumed due to left tension pneumothorax but can not rule out other etiologies at this time Shock- septic vs cardiogenic  Elevated troponin without acute ST changes  - 12 mins prior to ROSC and was f/c at [facility], sedated for vent compliance P:  Pressure support  as needed Trend EKG troponin and lactate  Acute encephalopathy after cardiac arrest and ? New unequal pupils  P:  Small subdural CT of the head Consider neurosurgery consult or neurological consult  Leukocytosis with recent tx for HCAP P:  Check culture data Procalcitonin 8.32 Continue empiric vancomycin cefepime and Flagyl White count fever curve trend  Disposition / Summary of Today's Plan 05/10/18   Ensure adequate oxygenation Chest tube for pneumothorax    Diet: NPO Pain/Anxiety/Delirium protocol (if indicated): fentanyl gtt,  prn versed for vent synchrony VAP protocol (if indicated): yes DVT prophylaxis: SCDs small subdural hematoma on CT scan GI prophylaxis: pepcid Hyperglycemia protocol: SSI  Mobility: bed rest  Code Status: Full  Family Communication: no family at bedside.  Officer remains at bedside.   Labs   CBC: Recent Labs  Lab 2018-05-25 1901 05/10/18 0342  WBC 36.4* 32.5*  NEUTROABS 36.4*  --   HGB 10.5* 12.9*  HCT 35.0* 43.1  MCV 96.4 96.2  PLT 313 318    Basic Metabolic Panel: Recent Labs  Lab 05/25/18 2215 05/10/18 0342 05/10/18 0752  NA 134* 135 137  K 5.6* 5.6* 4.9  CL 109 112* 113*  CO2 19* 18* 21*  GLUCOSE 243* 224* 191*  BUN 15 15 17   CREATININE 1.03 1.03 1.15  CALCIUM 6.2* 6.3* 5.9*  MG 1.8 1.7  --   PHOS 3.2 3.3  --    GFR: Estimated Creatinine Clearance: 55.3 mL/min (by C-G formula based on SCr of 1.15 mg/dL). Recent Labs  Lab 05-25-18 1901 05-25-2018 2215 05/10/18 0342  PROCALCITON  --  8.32  --   WBC 36.4*  --  32.5*  LATICACIDVEN  --  2.2*  --     Liver Function Tests: Recent Labs  Lab 2018/05/25 2215  AST 98*  ALT 87*  ALKPHOS 39  BILITOT 0.6  PROT 4.5*  ALBUMIN 2.4*   No results for input(s): LIPASE, AMYLASE in the last 168 hours. No results for input(s): AMMONIA in the last 168 hours.  ABG    Component Value Date/Time   PHART 7.081 (LL) 05/10/2018 0335   PCO2ART 73.1 (HH) 05/10/2018 0335   PO2ART 100 05/10/2018 0335   HCO3 20.8 05/10/2018 0335   ACIDBASEDEF 7.8 (H) 05/10/2018 0335   O2SAT 96.6 05/10/2018 0335     Coagulation Profile: Recent Labs  Lab May 25, 2018 1901  INR 1.80    Cardiac Enzymes: Recent Labs  Lab 05/25/18 2215 05/10/18 0342  TROPONINI 1.09* 1.37*    HbA1C: No results found for: HGBA1C  CBG: Recent Labs  Lab 05/25/2018 2004 05-25-18 2337 05/10/18 0343 05/10/18 0759  GLUCAP 173* 233* 193* 171*    Admitting History of Present Illness.   72 yoM with PMH significant for chronic hypoxic respiratory failure  on 4L O2 and COPD who presented to [facility] ER from jail with acute shortness of breath and altered mental status.   Recently admitted at [facility] 2 weeks ago for hypoxic respiratory failure secondary to AECOPD and HCAP.  On arrival, found to have absent left sided breath sounds, CXR confirming Left pneumothorax.  Chest tube was placed and patient with ongoing hypotension.  Patient with cardiac arrest for ~12 mins with ROSC, when intubated.  Reported that patient was following commands post arrest.  Since has had ongoing progressive shock.  CT of chest show some residual left pneumothorax with CT in place and large pneumoperitoneum thought to be dissected down from pneumothorax.  Initial labs in ER noted for WBC  17, Hgb 14.7, K 5.1, sCr 0.7, trop 1.16.  He was given 3L NS and zosyn.  Patient transferred to [facility] for further care by PCCM and available TCTS services if needed.  Review of Systems:   Unable  Critical care time: 45 mins   Brett Canales Minor ACNP [facility] Pager [phone] till 1 pm If no answer page [phone] 05/10/2018, 12:08 PM   Attending note: I have seen and examined the patient. History, labs and imaging reviewed.  72 year old with chronic hypoxic respiratory failure, COPD admitted with spontaneous left pneumothorax, cardiac arrest status post intubation and chest tube placement  Blood pressure 100/61, pulse (!) 109, temperature (!) 100.6 F (38.1 C), resp. rate (!) 34, height 5\' 8"  (1.727 m), weight 68.3 kg, SpO2 96 %. Gen:      No acute distress HEENT:  EOMI, sclera anicteric Neck:     No masses; no thyromegaly Lungs:    Left chest tubes with air leak CV:         Regular rate and rhythm; no murmurs Abd:      + bowel sounds; soft, non-tender; no palpable masses, no distension Ext:    No edema; adequate peripheral perfusion Skin:      Warm and dry; no rash Neuro: Sedated, intubated  Labs reviewed, significant for ABG 7.067/73/70 5/87% BUN/creatinine  17/1.15 WBC 32.5, hemoglobin 12.9, platelets 318  Imaging CT chest abdomen pelvis 11/6 -large amount of pneumoperitoneum, subcutaneous air, left-sided pneumothorax with chest tube, severe emphysema Bilateral lower lobe consolidation, diffuse coronary atherosclerosis.  Chest x-ray 05/10/2018-ET tube in good position, stable left apical pneumothorax, patchy consolidation in mid to lower lungs I have reviewed the images personally  Assessment/Plan: 72 year old with cardiac arrest secondary to spontaneous left pneumothorax setting of severe COPD, recent admission for H CAP  Respiratory failure- Hypoxic, hypercarbic Pneumothorax, HCAP Continue chest tube to suction Increase respiratory rate to 34, reduce Isp time to 0.6 to reduce auto PEEP Continue Vanco, cefepime, Flagyl Follow cultures  Cardiac arrest, encephalopathy Continue heavy sedation to ensure vent synchrony Noted to have small subdural hematoma.  Will review with neurosurgery.  The patient is critically ill with multiple organ systems failure and requires high complexity decision making for assessment and support, frequent evaluation and titration of therapies, application of advanced monitoring technologies and extensive interpretation of multiple databases.  Critical care time - 35 mins. This represents my time independent of the NPs time taking care of the pt.  Chilton Greathouse MD [place] [facility] 05/10/2018, 3:25 PM

## 2018-05-10 NOTE — Progress Notes (Signed)
CRITICAL VALUE STICKER  CRITICAL VALUE: lactic acid 5.1 MD NOTIFIED: Mannam RESPONSE: orders given  Made provider aware no output from chest tube today.

## 2018-05-10 NOTE — Progress Notes (Signed)
Discussed am labs with [facility]Link MD significant for K+ 5.6, Ca 6.3 and more,  as well as patient's status on dual pressors. Orders received to hold fluid bolus, lasix and that repeat labs were not to be done at this time. EKG completed. Awaiting new orders perhaps Kayexalate per MD. Will continue to monitor.  Carlyon Prows RN

## 2018-05-10 NOTE — Progress Notes (Signed)
CRITICAL VALUE ALERT  Critical Value:  Calcium 6.3 Date & Time Notied: 05/10/2018 0518

## 2018-05-11 ENCOUNTER — Inpatient Hospital Stay (HOSPITAL_COMMUNITY): Payer: Medicare Other

## 2018-05-11 LAB — CBC WITH DIFFERENTIAL/PLATELET
Abs Immature Granulocytes: 0.22 10*3/uL — ABNORMAL HIGH (ref 0.00–0.07)
BASOS ABS: 0 10*3/uL (ref 0.0–0.1)
Basophils Relative: 0 %
Eosinophils Absolute: 0 10*3/uL (ref 0.0–0.5)
Eosinophils Relative: 0 %
HEMATOCRIT: 34.3 % — AB (ref 39.0–52.0)
Hemoglobin: 10.4 g/dL — ABNORMAL LOW (ref 13.0–17.0)
IMMATURE GRANULOCYTES: 1 %
LYMPHS ABS: 0.3 10*3/uL — AB (ref 0.7–4.0)
LYMPHS PCT: 1 %
MCH: 28.4 pg (ref 26.0–34.0)
MCHC: 30.3 g/dL (ref 30.0–36.0)
MCV: 93.7 fL (ref 80.0–100.0)
Monocytes Absolute: 0.9 10*3/uL (ref 0.1–1.0)
Monocytes Relative: 4 %
NRBC: 0 % (ref 0.0–0.2)
Neutro Abs: 19.7 10*3/uL — ABNORMAL HIGH (ref 1.7–7.7)
Neutrophils Relative %: 94 %
Platelets: 174 10*3/uL (ref 150–400)
RBC: 3.66 MIL/uL — ABNORMAL LOW (ref 4.22–5.81)
RDW: 14 % (ref 11.5–15.5)
WBC: 21.2 10*3/uL — ABNORMAL HIGH (ref 4.0–10.5)

## 2018-05-11 LAB — GLUCOSE, CAPILLARY
GLUCOSE-CAPILLARY: 103 mg/dL — AB (ref 70–99)
GLUCOSE-CAPILLARY: 40 mg/dL — AB (ref 70–99)
GLUCOSE-CAPILLARY: 248 mg/dL — AB (ref 70–99)
Glucose-Capillary: 125 mg/dL — ABNORMAL HIGH (ref 70–99)
Glucose-Capillary: 125 mg/dL — ABNORMAL HIGH (ref 70–99)
Glucose-Capillary: 138 mg/dL — ABNORMAL HIGH (ref 70–99)
Glucose-Capillary: 174 mg/dL — ABNORMAL HIGH (ref 70–99)
Glucose-Capillary: 73 mg/dL (ref 70–99)

## 2018-05-11 LAB — COMPREHENSIVE METABOLIC PANEL
ALT: 55 U/L — AB (ref 0–44)
AST: 52 U/L — ABNORMAL HIGH (ref 15–41)
Albumin: 2.3 g/dL — ABNORMAL LOW (ref 3.5–5.0)
Alkaline Phosphatase: 33 U/L — ABNORMAL LOW (ref 38–126)
Anion gap: 7 (ref 5–15)
BUN: 27 mg/dL — ABNORMAL HIGH (ref 8–23)
CHLORIDE: 108 mmol/L (ref 98–111)
CO2: 19 mmol/L — AB (ref 22–32)
CREATININE: 1.61 mg/dL — AB (ref 0.61–1.24)
Calcium: 7 mg/dL — ABNORMAL LOW (ref 8.9–10.3)
GFR calc Af Amer: 47 mL/min — ABNORMAL LOW (ref >60)
GFR calc non Af Amer: 41 mL/min — ABNORMAL LOW (ref >60)
Glucose, Bld: 188 mg/dL — ABNORMAL HIGH (ref 70–99)
Potassium: 4.6 mmol/L (ref 3.5–5.1)
Sodium: 134 mmol/L — ABNORMAL LOW (ref 135–145)
Total Bilirubin: 0.8 mg/dL (ref 0.3–1.2)
Total Protein: 4.4 g/dL — ABNORMAL LOW (ref 6.5–8.1)

## 2018-05-11 LAB — PHOSPHORUS
PHOSPHORUS: 2.8 mg/dL (ref 2.5–4.6)
Phosphorus: 1.7 mg/dL — ABNORMAL LOW (ref 2.5–4.6)

## 2018-05-11 LAB — POCT I-STAT 3, ART BLOOD GAS (G3+)
Acid-base deficit: 9 mmol/L — ABNORMAL HIGH (ref 0.0–2.0)
Acid-base deficit: 7 mmol/L — ABNORMAL HIGH (ref 0.0–2.0)
BICARBONATE: 20.6 mmol/L (ref 20.0–28.0)
BICARBONATE: 20.4 mmol/L (ref 20.0–28.0)
O2 SAT: 97 %
O2 Saturation: 50 %
PCO2 ART: 60.6 mmHg — AB (ref 32.0–48.0)
PCO2 ART: 47.1 mmHg (ref 32.0–48.0)
PH ART: 7.141 — AB (ref 7.350–7.450)
Patient temperature: 37.2
Patient temperature: 98
TCO2: 22 mmol/L (ref 22–32)
TCO2: 22 mmol/L (ref 22–32)
pH, Arterial: 7.244 — ABNORMAL LOW (ref 7.350–7.450)
pO2, Arterial: 36 mmHg — CL (ref 83.0–108.0)
pO2, Arterial: 105 mmHg (ref 83.0–108.0)

## 2018-05-11 LAB — MAGNESIUM
MAGNESIUM: 2.5 mg/dL — AB (ref 1.7–2.4)
Magnesium: 1.8 mg/dL (ref 1.7–2.4)

## 2018-05-11 LAB — LACTIC ACID, PLASMA
LACTIC ACID, VENOUS: 1.5 mmol/L (ref 0.5–1.9)
Lactic Acid, Venous: 2.9 mmol/L (ref 0.5–1.9)

## 2018-05-11 LAB — TROPONIN I: TROPONIN I: 1.51 ng/mL — AB (ref <0.03)

## 2018-05-11 LAB — STREP PNEUMONIAE URINARY ANTIGEN: Strep Pneumo Urinary Antigen: NEGATIVE

## 2018-05-11 MED ORDER — LACTATED RINGERS IV BOLUS
1000.0000 mL | Freq: Once | INTRAVENOUS | Status: AC
  Administered 2018-05-11: 1000 mL via INTRAVENOUS

## 2018-05-11 MED ORDER — CALCIUM GLUCONATE-NACL 1-0.675 GM/50ML-% IV SOLN
1.0000 g | Freq: Once | INTRAVENOUS | Status: AC
  Administered 2018-05-11: 1000 mg via INTRAVENOUS
  Filled 2018-05-11: qty 50

## 2018-05-11 MED ORDER — MAGNESIUM SULFATE 2 GM/50ML IV SOLN
2.0000 g | Freq: Once | INTRAVENOUS | Status: AC
  Administered 2018-05-11: 2 g via INTRAVENOUS
  Filled 2018-05-11: qty 50

## 2018-05-11 MED ORDER — VITAL AF 1.2 CAL PO LIQD
1000.0000 mL | ORAL | Status: DC
  Administered 2018-05-11 – 2018-05-17 (×6): 1000 mL

## 2018-05-11 MED ORDER — VITAL HIGH PROTEIN PO LIQD: 1000.0000 mL | ORAL | Status: DC

## 2018-05-11 MED ORDER — MIDAZOLAM HCL 2 MG/2ML IJ SOLN
2.0000 mg | Freq: Once | INTRAMUSCULAR | Status: AC
  Administered 2018-05-11: 2 mg via INTRAVENOUS

## 2018-05-11 MED ORDER — DEXTROSE 50 % IV SOLN
INTRAVENOUS | Status: AC
  Administered 2018-05-11: 50 mL
  Filled 2018-05-11: qty 50

## 2018-05-11 MED ORDER — DEXMEDETOMIDINE HCL IN NACL 400 MCG/100ML IV SOLN
0.4000 ug/kg/h | INTRAVENOUS | Status: DC
  Administered 2018-05-11: 1.2 ug/kg/h via INTRAVENOUS
  Administered 2018-05-11: 0.4 ug/kg/h via INTRAVENOUS
  Administered 2018-05-11 – 2018-05-14 (×17): 1.2 ug/kg/h via INTRAVENOUS
  Administered 2018-05-15: 1 ug/kg/h via INTRAVENOUS
  Administered 2018-05-15 – 2018-05-16 (×8): 1.2 ug/kg/h via INTRAVENOUS
  Administered 2018-05-17: 1 ug/kg/h via INTRAVENOUS
  Administered 2018-05-17 – 2018-05-18 (×5): 1.2 ug/kg/h via INTRAVENOUS
  Filled 2018-05-11 (×23): qty 100
  Filled 2018-05-11: qty 200
  Filled 2018-05-11 (×3): qty 100
  Filled 2018-05-11: qty 200
  Filled 2018-05-11 (×5): qty 100

## 2018-05-11 MED ORDER — SODIUM CHLORIDE 0.9 % IV SOLN
1.0000 g | INTRAVENOUS | Status: DC
  Administered 2018-05-12: 1 g via INTRAVENOUS
  Filled 2018-05-11: qty 1

## 2018-05-11 MED ORDER — MIDAZOLAM HCL 2 MG/2ML IJ SOLN
INTRAMUSCULAR | Status: AC
  Administered 2018-05-11: 2 mg via INTRAVENOUS
  Filled 2018-05-11: qty 2

## 2018-05-11 MED FILL — Epinephrine PF Soln Prefilled Syringe 1 MG/10ML (0.1 MG/ML): INTRAMUSCULAR | Qty: 20 | Status: AC

## 2018-05-11 MED FILL — Norepinephrine-Dextrose IV Solution 4 MG/250ML-5%: INTRAVENOUS | Qty: 250 | Status: AC

## 2018-05-11 NOTE — Progress Notes (Signed)
Patient was transported to CT & back to 3M10 without any complications.  

## 2018-05-11 NOTE — Progress Notes (Signed)
Initial Nutrition Assessment  DOCUMENTATION CODES:   Not applicable  INTERVENTION:   Tube Feeding:  Vital AF 1.2 @ 65 ml/hr Provides 117 g of protein, 1872 kcals and 1264 mL of free water   NUTRITION DIAGNOSIS:   Inadequate oral intake related to acute illness as evidenced by NPO status. GOAL:   Patient will meet greater than or equal to 90% of their needs  MONITOR:   TF tolerance, Vent status, Labs, Weight trends  REASON FOR ASSESSMENT:   Consult, Ventilator Enteral/tube feeding initiation and management  ASSESSMENT:   72 yo male presented to [facility] from [place] with SOB and AMS; pt with acute on chronic respiratory failure with left tension PTX, AE COPD . Pt also underwent 12 min cardiac arrest requiring intubation and chest tube placement. PMH includes chronic respiratory failure with COPD on 4L O2 at baseline.    Patient is currently intubated on ventilator support MV: 10.6 L/min Temp (24hrs), Avg:100 F (37.8 C), Min:98.8 F (37.1 C), Max:100.9 F (38.3 C)  [employer] at bedside, no family. Unable to obtain diet and weight history at time of visit. No previous weight encounters  OG tube to LIS, 300 mL dark output in 24 hours Chest Tube with 100 mL yesterday, 100 mL today  Labs: sodium 134, Creatinine 1.61, BUN 27, corrected calcium 8.4, albumin 2.3, CBGs 40-174 Meds: precedex, solumedrol  NUTRITION - FOCUSED PHYSICAL EXAM:    Most Recent Value  Orbital Region  No depletion  Upper Arm Region  No depletion  Thoracic and Lumbar Region  No depletion  Buccal Region  Unable to assess  Temple Region  Mild depletion  Clavicle Bone Region  Mild depletion  Clavicle and Acromion Bone Region  Mild depletion  Scapular Bone Region  Unable to assess  Dorsal Hand  Unable to assess  Patellar Region  No depletion  Anterior Thigh Region  No depletion  Posterior Calf Region  No depletion  Edema (RD Assessment)  None       Diet Order:   Diet Order         Diet NPO time specified  Diet effective now              EDUCATION NEEDS:   Not appropriate for education at this time  Skin:  Skin Assessment: Reviewed RN Assessment  Last BM:  no documented BM  Height:   Ht Readings from Last 1 Encounters:  05/31/2018 5\' 8"  (1.727 m)    Weight:   Wt Readings from Last 1 Encounters:  05/11/18 69.4 kg    Ideal Body Weight:     BMI:  Body mass index is 23.26 kg/m.  Estimated Nutritional Needs:   Kcal:  1859 kcals   Protein:  100-135 g  Fluid:  >/= 1.8 L   Romelle Starcher MS, RD, LDN, CNSC [phone] Pager  [phone] Weekend/On-Call Pager

## 2018-05-11 NOTE — Progress Notes (Addendum)
NAME:  Julian Fernandez, MRN:  [id number], DOB:  05-Sep-1945, LOS: 2 ADMISSION DATE:  05/23/2018, CONSULTATION DATE:  05/08/2018 REFERRING MD:  [facility], CHIEF COMPLAINT:  Cardiac arrest   Brief History   72 yo M with PMH significant for chronic hypoxic respiratory failure on 4L O2 and COPD presented to [place] from [place] with SOB and AMS.  Found to have left tension pneumothorax.  Had 12 min cardiac arrest with intubation and CT placement.  Pt with progressive shock and new mass pneumoperitoneum.  Recent hospitalization 2 weeks ago for AECOPD and HCAP.   Past Medical History  COPD, chronic hypoxic respiratory failure   Significant Hospital Events   11/6 tx to [facility] from [place]/ cardiac arrest   Consults: date of consult/date signed off & final recs:   Procedures (surgical and bedside):  11/6 ETT >> 11/6 Left 30 f CT >> 11/6 L IJ TL >> 11/6 foley >>  Significant Diagnostic Tests:  11/6 CT Chest >> Left CT in good position with residual 25% pnx.  Massive pneumoperitoneum, most likely dissected own from the pneumothorax.  There is also extensive aire in the abdominal wall muscles.  No free fluid or obvious bowel injury.  Severe emphysema.  Bilateral pleural effusions.  CT Head 11/6 >> 3 mm thickness posterior left convexity subdural hematoma. No midline shift or other mass effect.  Echo 11/7>>Completely unable to visualize heart, even with attempt at using   echo contrast (definity). This may be due to air interface, such   as with pneumothorax or severe emphysema. Unable to comment on   any cardiac structures.  CXR 11/8>> Stable persistent hazy opacification over the right mid to lower lungs and stable left retrocardiac opacification. Emphysematous disease. Left-sided chest tube unchanged. No definite left pneumothorax visualized.   Micro Data:  11/6 BC x 2 >> 11/8 trach asp >> 11/8 Urine Culture , Strep/ legionella >>   Antimicrobials:  11/6 vanc >> 11/6 cefepime >> 11/6  Flagyl >>  Subjective:  Currently on vasopressor support with Levo and Vasopressin .  Noted to have a small subdural hematoma on CT of the head. Developed leak around chest tube site 11/8, sutured x 2 areas per PCCM APP.  Objective   Blood pressure 135/75, pulse (!) 103, temperature 99.5 F (37.5 C), resp. rate (!) 34, height 5\' 8"  (1.727 m), weight 69.4 kg, SpO2 100 %. CVP:  [6 mmHg-89 mmHg] 13 mmHg  Vent Mode: PRVC FiO2 (%):  [70 %-80 %] 70 % Set Rate:  [34 bmp] 34 bmp Vt Set:  [410 mL] 410 mL PEEP:  [5 cmH20] 5 cmH20 Plateau Pressure:  [15 cmH20-21 cmH20] 15 cmH20   Intake/Output Summary (Last 24 hours) at 05/11/2018 0924 Last data filed at 05/11/2018 0900 Gross per 24 hour  Intake 5380.98 ml  Output 1350 ml  Net 4030.98 ml   Filed Weights   05/10/18 0500 05/11/18 0500  Weight: 68.3 kg 69.4 kg    Examination: General: Elderly male who is sedated and intubated handcuffed to the bed and he withdraws bilaterally but does not follow commands HEENT: Tracheal tube in place, OG tube Neuro: Withdraws , gravitates to the right side, Follow no commands, MAE x 4 CV: Distant, S1, S2, RRR, No RMG PULM: Chest tube 2/5  air leak, intubated on full mechanical ventilatory support WU:[id number], non-tender, bsx4 active  Extremities: warm/dry, 1+ edema  Skin: no rashes or lesions, pale  Officer remains at bedside.   Resolved Hospital Problem list  Assessment & Plan:  Acute on chronic hypoxic respiratory failure - multifactorial related to Left tension pneumothorax, AECOPD, +/- HCAP, cardiac arrest  Vent dyssynchrony  Pneumoperitoneum - unclear etiology- ? 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 - CO2 down trending per ABG P:  Continue full mechanical dilatory support Monitor for auto PEEP Trend  arterial blood gas X-ray  shows almost complete resolution of pneumothorax Continue CT to 20 cm suction Solu-Medrol with history of CPAP COPD Saturation goals are 88-94% CXR daily Add Precedex Change ceiling fentanyl to 250 mcg RASS goal -2 Consider TCTS consult  Cardiac Arrest presumed due to left tension pneumothorax but can not rule out other etiologies at this time Shock- septic vs cardiogenic  Elevated troponin without acute ST changes  - 12 mins prior to ROSC and was f/c at [facility], sedated for vent compliance Remains on norepi at 16 mcg and vasopressin at 0.03 Lactate down trending NSR per tele P:  Wean pressors as able Map Goal is >65 Trend  troponin and lactate EKG prn  ID CXR with opacification No Sputum or Urine Cx PCT 11/6>> 8.32 WBC 21.2 T Max 100.9 Plan Collect Sputum and Urine for Cx Follow micro/ blood Continue ABX as above Tend PCT Trend WBC and Fever curve   Acute encephalopathy after cardiac arrest and ? New unequal pupils  Small Sub Dural on CT head  P:  Small subdural CT of the head No seizure activity Requires heavy sedation Plan: Consider neurosurgery consult or neurological consult Neuro checks per routine CT Head to evaluate sub dural 11/8   Renal Hyperkalemia>> resolved Hypocalcemia Creatinine up trending Plan Monitor BMET Monitor UO Trend and replete electrolytes as needed Avoid nephro toxic medications Replete calcium  Endocrine Plan CBG Q 4 SSI  Nutrition Plan Initiate TF   Disposition / Summary of Today's Plan 05/11/18   Worsening air leak per L CT Start TF Add Precedex for agitation Culture sputum and urine     Diet: Initiate TF Pain/Anxiety/Delirium protocol (if indicated): fentanyl gtt, prn versed for vent synchrony, will add precedex VAP protocol (if indicated): yes DVT prophylaxis: SCDs small subdural hematoma on CT scan GI prophylaxis: pepcid Hyperglycemia protocol: SSI  Mobility: bed rest  Code Status: Full  Family  Communication: no family at bedside.  Officer remains at bedside.   Labs   CBC: Recent Labs  Lab 05/17/2018 1901 05/10/18 0342 05/11/18 0403  WBC 36.4* 32.5* 21.2*  NEUTROABS 36.4*  --  19.7*  HGB 10.5* 12.9* 10.4*  HCT 35.0* 43.1 34.3*  MCV 96.4 96.2 93.7  PLT 313 318 174    Basic Metabolic Panel: Recent Labs  Lab 05/06/2018 2215 05/10/18 0342 05/10/18 0752 05/10/18 1510 05/10/18 1755 05/11/18 0403  NA 134* 135 137  --   --  134*  K 5.6* 5.6* 4.9 5.0 5.1 4.6  CL 109 112* 113*  --   --  108  CO2 19* 18* 21*  --   --  19*  GLUCOSE 243* 224* 191*  --   --  188*  BUN 15 15 17   --   --  27*  CREATININE 1.03 1.03 1.15  --   --  1.61*  CALCIUM 6.2* 6.3* 5.9*  --   --  7.0*  MG 1.8 1.7  --   --   --  1.8  PHOS 3.2 3.3  --   --   --  2.8   GFR: Estimated Creatinine Clearance: 39.5 mL/min (A) (by C-G formula based on SCr of 1.61 mg/dL (H)). Recent Labs  Lab 05/24/2018 1901 05/27/2018 2215 05/10/18 0342 05/10/18 1510 05/10/18 2033 05/11/18 0403  PROCALCITON  --  8.32  --   --   --   --   WBC 36.4*  --  32.5*  --   --  21.2*  LATICACIDVEN  --  2.2*  --  5.1* 4.2* 2.9*    Liver Function Tests: Recent Labs  Lab 05/15/2018 2215 05/11/18 0403  AST 98* 52*  ALT 87* 55*  ALKPHOS 39 33*  BILITOT 0.6 0.8  PROT 4.5* 4.4*  ALBUMIN 2.4* 2.3*   No results for input(s): LIPASE, AMYLASE in the last 168 hours. No results for input(s): AMMONIA in the last 168 hours.  ABG    Component Value Date/Time   PHART 7.244 (L) 05/11/2018 0534   PCO2ART 47.1 05/11/2018 0534   PO2ART 105.0 05/11/2018 0534   HCO3 20.4 05/11/2018 0534   TCO2 22 05/11/2018 0534   ACIDBASEDEF 7.0 (H) 05/11/2018 0534   O2SAT 97.0 05/11/2018 0534     Coagulation Profile: Recent Labs  Lab 05/31/2018 1901  INR 1.80    Cardiac Enzymes: Recent Labs  Lab 05/08/2018 2215 05/10/18 0342 05/11/18 0403  TROPONINI 1.09* 1.37* 1.51*    HbA1C: No results found for: HGBA1C  CBG: Recent Labs  Lab  05/10/18 2157 05/11/18 0016 05/11/18 0400 05/11/18 0739 05/11/18 0813  GLUCAP 138* 138* 174* 73 125*    Admitting History of Present Illness.   40 yoM with PMH significant for chronic hypoxic respiratory failure on 4L O2 and COPD who [facility] with acute shortness of breath and altered mental status.   Recently admitted at [facility] 2 weeks ago for hypoxic respiratory failure secondary to AECOPD and HCAP.  On arrival, found to have absent left sided breath sounds, CXR confirming Left pneumothorax.  Chest tube was placed and patient with ongoing hypotension.  Patient with cardiac arrest for ~12 mins with ROSC, when intubated.  Reported that patient was following commands post arrest.  Since has had ongoing progressive shock.  CT of chest show some residual left pneumothorax with CT in place and large pneumoperitoneum thought to be dissected down from pneumothorax.  Initial labs in ER noted for WBC 17, Hgb 14.7, K 5.1, sCr 0.7, trop 1.16.  He was given 3L NS and zosyn.  Patient transferred to [facility] for further care by PCCM and available TCTS services if needed.    Critical care time: 45 mins     Bevelyn Ngo, AGACNP-BC [facility] Pulmonary/Critical Care Medicine Pager # [phone] Pager after 3 pm [phone] 05/11/2018, 9:24 AM

## 2018-05-11 NOTE — Progress Notes (Signed)
E link MD notified of new air leak in chest tube and new sq air. See new orders.

## 2018-05-11 NOTE — Progress Notes (Signed)
PCCM INTERVAL PROGRESS NOTE   Called to evaluate patient for new air leak at chest tube site and new 2/7 leak on pleuravac device. Upon my arrival there is air audibly coming out around the chest tube. No sutures placed incision. I placed 2 sutures, one on either side of tube to close incision. The audible air rush has resolved, however, 2/5 pleuravac leak persists. Also there is a difference between inspired and expired volumes on vent. The patient is not air trapping and airway pressures are not high. CXR shows tube in place as it was on prior imaging. PTX is stable.   Plan: Sutures placed.  Repeat CXR in the morning.   Joneen Roach, AGACNP-BC [facility] Pulmonary/Critical Care Pager [phone] or [phone]  05/11/2018 1:56 AM

## 2018-05-12 ENCOUNTER — Inpatient Hospital Stay (HOSPITAL_COMMUNITY): Payer: Medicare Other

## 2018-05-12 DIAGNOSIS — J95812 Postprocedural air leak: Secondary | ICD-10-CM

## 2018-05-12 LAB — COMPREHENSIVE METABOLIC PANEL
ALK PHOS: 30 U/L — AB (ref 38–126)
ALT: 43 U/L (ref 0–44)
AST: 45 U/L — AB (ref 15–41)
Albumin: 2.1 g/dL — ABNORMAL LOW (ref 3.5–5.0)
Anion gap: 5 (ref 5–15)
BILIRUBIN TOTAL: 0.5 mg/dL (ref 0.3–1.2)
BUN: 28 mg/dL — ABNORMAL HIGH (ref 8–23)
CALCIUM: 6.9 mg/dL — AB (ref 8.9–10.3)
CO2: 22 mmol/L (ref 22–32)
CREATININE: 1.06 mg/dL (ref 0.61–1.24)
Chloride: 108 mmol/L (ref 98–111)
GFR calc Af Amer: >60 mL/min (ref >60)
Glucose, Bld: 161 mg/dL — ABNORMAL HIGH (ref 70–99)
Potassium: 3.5 mmol/L (ref 3.5–5.1)
Sodium: 135 mmol/L (ref 135–145)
TOTAL PROTEIN: 4 g/dL — AB (ref 6.5–8.1)

## 2018-05-12 LAB — POCT I-STAT 3, ART BLOOD GAS (G3+)
Acid-base deficit: 5 mmol/L — ABNORMAL HIGH (ref 0.0–2.0)
Bicarbonate: 20 mmol/L (ref 20.0–28.0)
O2 Saturation: 96 %
PCO2 ART: 36.3 mmHg (ref 32.0–48.0)
PH ART: 7.349 — AB (ref 7.350–7.450)
PO2 ART: 89 mmHg (ref 83.0–108.0)
Patient temperature: 98.6
TCO2: 21 mmol/L — ABNORMAL LOW (ref 22–32)

## 2018-05-12 LAB — CBC
HEMATOCRIT: 30.9 % — AB (ref 39.0–52.0)
HEMOGLOBIN: 9.5 g/dL — AB (ref 13.0–17.0)
MCH: 28.1 pg (ref 26.0–34.0)
MCHC: 30.7 g/dL (ref 30.0–36.0)
MCV: 91.4 fL (ref 80.0–100.0)
NRBC: 0 % (ref 0.0–0.2)
Platelets: 120 10*3/uL — ABNORMAL LOW (ref 150–400)
RBC: 3.38 MIL/uL — ABNORMAL LOW (ref 4.22–5.81)
RDW: 13.9 % (ref 11.5–15.5)
WBC: 8.5 10*3/uL (ref 4.0–10.5)

## 2018-05-12 LAB — PROCALCITONIN: PROCALCITONIN: 3 ng/mL

## 2018-05-12 LAB — MAGNESIUM
Magnesium: 2.2 mg/dL (ref 1.7–2.4)
Magnesium: 2.2 mg/dL (ref 1.7–2.4)

## 2018-05-12 LAB — GLUCOSE, CAPILLARY
GLUCOSE-CAPILLARY: 138 mg/dL — AB (ref 70–99)
GLUCOSE-CAPILLARY: 187 mg/dL — AB (ref 70–99)
GLUCOSE-CAPILLARY: 113 mg/dL — AB (ref 70–99)
GLUCOSE-CAPILLARY: 132 mg/dL — AB (ref 70–99)
Glucose-Capillary: 117 mg/dL — ABNORMAL HIGH (ref 70–99)
Glucose-Capillary: 133 mg/dL — ABNORMAL HIGH (ref 70–99)

## 2018-05-12 LAB — TROPONIN I: TROPONIN I: 0.56 ng/mL — AB (ref <0.03)

## 2018-05-12 LAB — URINE CULTURE
CULTURE: NO GROWTH
Special Requests: NORMAL

## 2018-05-12 LAB — PHOSPHORUS
Phosphorus: 1.5 mg/dL — ABNORMAL LOW (ref 2.5–4.6)
Phosphorus: 5 mg/dL — ABNORMAL HIGH (ref 2.5–4.6)

## 2018-05-12 LAB — VANCOMYCIN, TROUGH: Vancomycin Tr: 15 ug/mL (ref 15–20)

## 2018-05-12 MED ORDER — POTASSIUM PHOSPHATES 15 MMOLE/5ML IV SOLN
20.0000 mmol | Freq: Once | INTRAVENOUS | Status: AC
  Administered 2018-05-12: 20 mmol via INTRAVENOUS
  Filled 2018-05-12: qty 6.67

## 2018-05-12 MED ORDER — METHYLPREDNISOLONE SODIUM SUCC 40 MG IJ SOLR
40.0000 mg | Freq: Two times a day (BID) | INTRAMUSCULAR | Status: DC
  Administered 2018-05-12 – 2018-05-14 (×4): 40 mg via INTRAVENOUS
  Filled 2018-05-12 (×4): qty 1

## 2018-05-12 MED ORDER — SODIUM CHLORIDE 0.9 % IV SOLN
1.0000 g | Freq: Three times a day (TID) | INTRAVENOUS | Status: AC
  Administered 2018-05-12 – 2018-05-14 (×8): 1 g via INTRAVENOUS
  Filled 2018-05-12 (×8): qty 1

## 2018-05-12 NOTE — Progress Notes (Signed)
Pharmacy Antibiotic Note  Julian Fernandez is a 72 y.o. male admitted to [facility] on 2018-05-24 with SOB and AMS. Found to have left pneumothorax and then suffered a cardiac arrest. Patient then transferred to [facility]. Pharmacy has been consulted for cefepime dosing for sepsis. Pt is afebrile and WBC is WNL. SCr improved today.   Plan: Change cefepime to 1gm IV Q8H F/u renal fxn, C&S, clinical status and LOT  Height: 5\' 8"  (172.7 cm) Weight: 168 lb 3.4 oz (76.3 kg) IBW/kg (Calculated) : 68.4  Temp (24hrs), Avg:98.1 F (36.7 C), Min:96.4 F (35.8 C), Max:100 F (37.8 C)  Recent Labs  Lab 05-24-2018 1901 24-May-2018 2215 05/10/18 0342 05/10/18 0752 05/10/18 1510 05/10/18 2033 05/11/18 0403 05/11/18 1537 05/12/18 0322  WBC 36.4*  --  32.5*  --   --   --  21.2*  --  8.5  CREATININE  --  1.03 1.03 1.15  --   --  1.61*  --  1.06  LATICACIDVEN  --  2.2*  --   --  5.1* 4.2* 2.9* 1.5  --   VANCOTROUGH  --   --   --   --   --   --   --   --  15    Estimated Creatinine Clearance: 60 mL/min (by C-G formula based on SCr of 1.06 mg/dL).    No Known Allergies  Vanc 11/6 >>11/9 Cefepime 11/6 >> Flagyl 11/6 >>  11/6 MRSA PCR - neg 11/6 BCx - ngtd 11/8 Urine - NEG  Lysle Pearl, PharmD, BCPS Please see AMION for all pharmacy numbers 05/12/2018 2:52 PM

## 2018-05-12 NOTE — Progress Notes (Signed)
NAME:  Julian Fernandez, MRN:  [id number], DOB:  16-Jul-1945, LOS: 3 ADMISSION DATE:  05/21/2018, CONSULTATION DATE:  05/17/2018 REFERRING MD:  [facility], CHIEF COMPLAINT:  Cardiac arrest   Brief History   72 yo M with PMH significant for chronic hypoxic respiratory failure on 4L O2 and COPD presented to [place] from [place] with SOB and AMS.  Found to have left tension pneumothorax.  Had 12 min cardiac arrest with intubation and CT placement.  Pt with progressive shock and new mass pneumoperitoneum.  Recent hospitalization 2 weeks ago for AECOPD and HCAP.   Past Medical History  COPD, chronic hypoxic respiratory failure   Significant Hospital Events   11/6 tx to [facility] from [place]/ cardiac arrest   Consults: date of consult/date signed off & final recs:  CVTS 11/9>   Procedures (surgical and bedside):  11/6 ETT >> 11/6 Left 30 f CT >> 11/6 L IJ TL >> 11/6 foley >>  Significant Diagnostic Tests:  11/6 CT Chest >> Left CT in good position with residual 25% pnx.  Massive pneumoperitoneum, most likely dissected own from the pneumothorax.  There is also extensive aire in the abdominal wall muscles.  No free fluid or obvious bowel injury.  Severe emphysema.  Bilateral pleural effusions.  CT Head 11/6 >> 3 mm thickness posterior left convexity subdural hematoma. No midline shift or other mass effect.  Echo 11/7>>Completely unable to visualize heart, even with attempt at using   echo contrast (definity). This may be due to air interface, such   as with pneumothorax or severe emphysema. Unable to comment on   any cardiac structures.   Micro Data:  11/6 BC x 2 >> 11/8 trach asp >> 11/8 Urine Culture , Strep/ legionella >>   Antimicrobials:  11/6 vanc >> 11/9 11/6 cefepime >> 11/6 Flagyl >>  Subjective:  Off pressors, continues on the vent, unresponsive Continues to have a large air leak in the chest tube.   Objective   Blood pressure (!) 148/124, pulse 98, temperature 99.9 F (37.7  C), resp. rate 18, height 5\' 8"  (1.727 m), weight 76.3 kg, SpO2 96 %. CVP:  [10 mmHg-28 mmHg] 28 mmHg  Vent Mode: CPAP;PSV FiO2 (%):  [40 %-50 %] 40 % Set Rate:  [34 bmp] 34 bmp Vt Set:  [410 mL] 410 mL PEEP:  [5 cmH20] 5 cmH20 Pressure Support:  [8 cmH20] 8 cmH20 Plateau Pressure:  [14 cmH20-29 cmH20] 29 cmH20   Intake/Output Summary (Last 24 hours) at 05/12/2018 1159 Last data filed at 05/12/2018 1146 Gross per 24 hour  Intake 5052.38 ml  Output 1655 ml  Net 3397.38 ml   Filed Weights   05/10/18 0500 05/11/18 0500 05/12/18 0500  Weight: 68.3 kg 69.4 kg 76.3 kg    Examination: Gen:      No acute distress HEENT:  EOMI, sclera anicteric Neck:     No masses; no thyromegaly Lungs:    Clear to auscultation bilaterally; normal respiratory effort, chest tube with large air leak. CV:         Regular rate and rhythm; no murmurs Abd:      + bowel sounds; soft, non-tender; no palpable masses, no distension Ext:    No edema; adequate peripheral perfusion Skin:      Warm and dry; no rash Neuro: Sedated, does not follow commands.  Resolved Hospital Problem list    Assessment & Plan:  Acute on chronic hypoxic respiratory failure - multifactorial related to Left tension pneumothorax, AECOPD, +/-  HCAP, cardiac arrest  Vent dyssynchrony  Pneumoperitoneum - unclear etiology- ? 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  ID CXR with opacification No Sputum or Urine Cx PCT 11/6>> 8.32 WBC 21.2 T Max 100.9 Plan Follow cultures Stop Vanco Continue cefepime and Flagyl for now.  Acute encephalopathy after cardiac arrest and ? New unequal pupils  Small Sub Dural on CT head  P:  Small subdural CT of the head > stable on repeat ? Seizure activitu Requires heavy sedation Plan: Monitor subdural.  No interventions per neurosurgery Get MRI head to assess anoxic injury.   AKI, hypophosphatemia Monitor renal function Replete potassium.  Endocrine Plan CBG Q 4 SSI  Nutrition Plan Continue tube feeds   Disposition / Summary of Today's Plan 05/12/18   MRI to assess neuro status CVTS consulted for [place]    Diet: Initiate TF Pain/Anxiety/Delirium protocol (if indicated): fentanyl gtt, prn versed for vent synchrony, will add precedex VAP protocol (if indicated): yes DVT prophylaxis: SCDs small subdural hematoma on CT scan GI prophylaxis: pepcid Hyperglycemia protocol: SSI  Mobility: bed rest  Code Status: Full  Family Communication: no family at bedside.  Officer remains at bedside.   Labs   CBC: Recent Labs  Lab 05/31/18 1901 05/10/18 0342 05/11/18 0403 05/12/18 0322  WBC 36.4* 32.5* 21.2* 8.5  NEUTROABS 36.4*  --  19.7*  --   HGB 10.5* 12.9* 10.4* 9.5*  HCT 35.0* 43.1 34.3* 30.9*  MCV 96.4 96.2 93.7 91.4  PLT 313 318 174 120*    Basic Metabolic Panel: Recent Labs  Lab 2018/05/31 2215 05/10/18 0342 05/10/18 0752 05/10/18 1510 05/10/18 1755 05/11/18 0403 05/11/18 1744 05/12/18 0322  NA 134* 135 137  --   --  134*  --  135  K 5.6* 5.6* 4.9 5.0 5.1 4.6  --  3.5  CL 109 112* 113*  --   --  108  --  108  CO2 19* 18* 21*  --   --  19*  --  22  GLUCOSE 243* 224* 191*  --   --  188*  --  161*  BUN 15 15 17   --   --  27*  --  28*  CREATININE 1.03 1.03 1.15  --   --  1.61*  --  1.06  CALCIUM 6.2* 6.3* 5.9*  --   --  7.0*  --  6.9*  MG 1.8  1.7  --   --   --  1.8 2.5* 2.2  PHOS 3.2 3.3  --   --   --  2.8 1.7* 1.5*   GFR: Estimated Creatinine Clearance: 60 mL/min (by C-G formula based on SCr of 1.06 mg/dL). Recent Labs  Lab 05-31-18 1901  May 31, 2018 2215 05/10/18 0342 05/10/18 1510 05/10/18 2033 05/11/18 0403 05/11/18 1537 05/12/18 0322  PROCALCITON  --   --  8.32  --   --   --   --   --  3.00  WBC 36.4*  --   --  32.5*  --   --  21.2*  --  8.5  LATICACIDVEN  --    < > 2.2*  --  5.1* 4.2* 2.9* 1.5  --    < > = values in this interval not displayed.    Liver Function Tests: Recent Labs  Lab May 20, 2018 2215 05/11/18 0403 05/12/18 0322  AST 98* 52* 45*  ALT 87* 55* 43  ALKPHOS 39 33* 30*  BILITOT 0.6 0.8 0.5  PROT 4.5* 4.4* 4.0*  ALBUMIN 2.4* 2.3* 2.1*   No results for input(s): LIPASE, AMYLASE in the last 168 hours. No results for input(s): AMMONIA in the last 168 hours.  ABG    Component Value Date/Time   PHART 7.349 (L) 05/12/2018 0432   PCO2ART 36.3 05/12/2018 0432   PO2ART 89.0 05/12/2018 0432   HCO3 20.0 05/12/2018 0432   TCO2 21 (L) 05/12/2018 0432   ACIDBASEDEF 5.0 (H) 05/12/2018 0432   O2SAT 96.0 05/12/2018 0432     Coagulation Profile: Recent Labs  Lab 05-20-2018 1901  INR 1.80    Cardiac Enzymes: Recent Labs  Lab 05/20/2018 2215 05/10/18 0342 05/11/18 0403  TROPONINI 1.09* 1.37* 1.51*    HbA1C: No results found for: HGBA1C  CBG: Recent Labs  Lab 05/11/18 1946 05/12/18 0027 05/12/18 0320 05/12/18 0743 05/12/18 1136  GLUCAP 125* 117* 138* 187* 113*    The patient is critically ill with multiple organ system failure and requires high complexity decision making for assessment and support, frequent evaluation and titration of therapies, advanced monitoring, review of radiographic studies and interpretation of complex data.   Critical Care Time devoted to patient care services, exclusive of separately billable procedures, described in this note is 35  minutes.   Chilton Greathouse  MD [place] Pulmonary and Critical Care Pager [phone] If no answer or after 3pm call: [phone] 05/12/2018, 11:59 AM

## 2018-05-12 NOTE — Consult Note (Signed)
Reason for Consult:Air leak Referring Physician: Dr. Nash Mantis Julian Fernandez is an 72 y.o. male.  HPI:Julian Fernandez is a 72 yo man with a history of tobacco abuse and COPD with chronic hypoxic respiratory failure on 4L [place] baseline. Presented to [facility] ED on 11/6 with SOB and AMS. Found to have a left tension pneumothorax. Chest tube placed. Had a cardiac arrest requiring 12 minutes of resuscitation. Intubated. Transferred to [facility]. Was in shock on arrival. Has had positive troponin of 1.51. ABG initially showed respiratory acidosis and hypercarbia- have improved with time.  CT chest on 11/6 showed some residual pneumothorax. Severe end stage emphysema. Required vasopressors. He has been treated with broad spectrum antibiotics.  PMH Tobacco abuse COPD Pneumonia  Family history- [place]  Social History:  has no tobacco, alcohol, and drug history on file.  Allergies: No Known Allergies  Medications:  Scheduled: . arformoterol  15 mcg Nebulization BID  . budesonide (PULMICORT) nebulizer solution  0.5 mg Nebulization BID  . chlorhexidine gluconate (MEDLINE KIT)  15 mL Mouth Rinse BID  . insulin aspart  0-9 Units Subcutaneous Q4H  . ipratropium-albuterol  3 mL Nebulization Q4H  . mouth rinse  15 mL Mouth Rinse 10 times per day  . methylPREDNISolone (SOLU-MEDROL) injection  40 mg Intravenous Q12H    Results for orders placed or performed during the hospital encounter of 05/24/2018 (from the past 48 hour(s))  Potassium     Status: None   Collection Time: 05/10/18  5:55 PM  Result Value Ref Range   Potassium 5.1 3.5 - 5.1 mmol/L    Comment: Performed at [facility], [place] [address] [place],  [address]  Glucose, capillary     Status: None   Collection Time: 05/10/18  8:31 PM  Result Value Ref Range   Glucose-Capillary 75 70 - 99 mg/dL  Lactic acid, plasma     Status: Abnormal   Collection Time: 05/10/18  8:33 PM  Result Value Ref Range   Lactic Acid, Venous 4.2 (HH) 0.5 - 1.9  mmol/L    Comment: CRITICAL RESULT CALLED TO, READ BACK BY AND VERIFIED WITH: CUMMINGS B,RN 05/10/18 2159 WAYK Performed at [facility], [place] [address], [place], [place] [address]   Glucose, capillary     Status: Abnormal   Collection Time: 05/10/18  9:57 PM  Result Value Ref Range   Glucose-Capillary 138 (H) 70 - 99 mg/dL  Glucose, capillary     Status: Abnormal   Collection Time: 05/11/18 12:16 AM  Result Value Ref Range   Glucose-Capillary 138 (H) 70 - 99 mg/dL  Glucose, capillary     Status: Abnormal   Collection Time: 05/11/18  4:00 AM  Result Value Ref Range   Glucose-Capillary 174 (H) 70 - 99 mg/dL  CBC with Differential/Platelet     Status: Abnormal   Collection Time: 05/11/18  4:03 AM  Result Value Ref Range   WBC 21.2 (H) 4.0 - 10.5 K/uL   RBC 3.66 (L) 4.22 - 5.81 MIL/uL   Hemoglobin 10.4 (L) 13.0 - 17.0 g/dL   HCT 34.3 (L) 39.0 - 52.0 %   MCV 93.7 80.0 - 100.0 fL   MCH 28.4 26.0 - 34.0 pg   MCHC 30.3 30.0 - 36.0 g/dL   RDW 14.0 11.5 - 15.5 %   Platelets 174 150 - 400 K/uL   nRBC 0.0 0.0 - 0.2 %   Neutrophils Relative % 94 %   Neutro Abs 19.7 (H) 1.7 - 7.7  K/uL   Lymphocytes Relative 1 %   Lymphs Abs 0.3 (L) 0.7 - 4.0 K/uL   Monocytes Relative 4 %   Monocytes Absolute 0.9 0.1 - 1.0 K/uL   Eosinophils Relative 0 %   Eosinophils Absolute 0.0 0.0 - 0.5 K/uL   Basophils Relative 0 %   Basophils Absolute 0.0 0.0 - 0.1 K/uL   Immature Granulocytes 1 %   Abs Immature Granulocytes 0.22 (H) 0.00 - 0.07 K/uL    Comment: Performed at [facility] [address], [place], [place] [address]  Magnesium     Status: None   Collection Time: 05/11/18  4:03 AM  Result Value Ref Range   Magnesium 1.8 1.7 - 2.4 mg/dL    Comment: Performed at [facility] [address], [place], [place] [address]  Phosphorus     Status: None   Collection Time: 05/11/18  4:03 AM  Result Value Ref Range   Phosphorus 2.8 2.5 - 4.6 mg/dL    Comment: Performed at [facility] [address], [place], [place] [address]  Comprehensive metabolic panel     Status: Abnormal   Collection Time: 05/11/18  4:03 AM  Result Value Ref Range   Sodium 134 (L) 135 - 145 mmol/L   Potassium 4.6 3.5 - 5.1 mmol/L   Chloride 108 98 - 111 mmol/L   CO2 19 (L) 22 - 32 mmol/L   Glucose, Bld 188 (H) 70 - 99 mg/dL   BUN 27 (H) 8 - 23 mg/dL   Creatinine, Ser 1.61 (H) 0.61 - 1.24 mg/dL   Calcium 7.0 (L) 8.9 - 10.3 mg/dL   Total Protein 4.4 (L) 6.5 - 8.1 g/dL   Albumin 2.3 (L) 3.5 - 5.0 g/dL   AST 52 (H) 15 - 41 U/L   ALT 55 (H) 0 - 44 U/L   Alkaline Phosphatase 33 (L) 38 - 126 U/L   Total Bilirubin 0.8 0.3 - 1.2 mg/dL   GFR calc non Af Amer 41 (L) >60 mL/min   GFR calc Af Amer 47 (L) >60 mL/min    Comment: (NOTE) The eGFR has been calculated using the CKD EPI equation. This calculation has not been validated in all clinical situations. eGFR's persistently <60 mL/min signify possible Chronic Kidney Disease.    Anion gap 7 5 - 15    Comment: Performed at [facility] [address], [place], [place] [address]  Lactic acid, plasma     Status: Abnormal   Collection Time: 05/11/18  4:03 AM  Result Value Ref Range   Lactic Acid, Venous 2.9 (HH) 0.5 - 1.9 mmol/L    Comment: CRITICAL RESULT CALLED TO, READ BACK BY AND VERIFIED WITH: CUMMINGS B,RN 05/11/18 0529 WAYK Performed at [facility] [address] [place], [place] [address]   Troponin I     Status: Abnormal   Collection Time: 05/11/18  4:03 AM  Result Value Ref Range   Troponin I 1.51 (HH) <0.03 ng/mL    Comment: CRITICAL VALUE NOTED.  VALUE IS CONSISTENT WITH PREVIOUSLY REPORTED AND CALLED VALUE. Performed at [facility], [place] [address] [place], [place] [address]   I-STAT 3, arterial blood gas (G3+)     Status: Abnormal   Collection Time: 05/11/18  5:07 AM  Result Value Ref Range   pH, Arterial 7.141 (LL) 7.350 - 7.450   pCO2 arterial 60.6 (H) 32.0 - 48.0 mmHg   pO2, Arterial 36.0 (LL)  83.0 - 108.0 mmHg   Bicarbonate 20.6 20.0 - 28.0 mmol/L   TCO2 22 22 - 32 mmol/L   O2 Saturation 50.0 %   Acid-base deficit 9.0 (H) 0.0 - 2.0 mmol/L   Patient temperature 37.2 C    Collection site RADIAL, ALLEN'S TEST ACCEPTABLE    Drawn by Operator    Sample type ARTERIAL    Comment MD NOTIFIED, REPEAT TEST   I-STAT 3, arterial blood gas (G3+)     Status: Abnormal   Collection Time: 05/11/18  5:34 AM  Result Value Ref Range   pH, Arterial 7.244 (L) 7.350 - 7.450   pCO2 arterial 47.1 32.0 - 48.0 mmHg   pO2, Arterial 105.0 83.0 - 108.0 mmHg   Bicarbonate 20.4 20.0 - 28.0 mmol/L   TCO2 22 22 - 32 mmol/L   O2 Saturation 97.0 %   Acid-base deficit 7.0 (H) 0.0 - 2.0 mmol/L   Patient temperature 98.0 F    Collection site RADIAL, ALLEN'S TEST ACCEPTABLE    Drawn by RT    Sample type ARTERIAL   Glucose, capillary     Status: None   Collection Time: 05/11/18  7:39 AM  Result Value Ref Range   Glucose-Capillary 73 70 - 99 mg/dL  Glucose, capillary     Status: Abnormal   Collection Time: 05/11/18  8:13 AM  Result Value Ref Range   Glucose-Capillary 125 (H) 70 - 99 mg/dL  Strep pneumoniae urinary antigen     Status: None   Collection Time: 05/11/18 10:34 AM  Result Value Ref Range   Strep Pneumo Urinary Antigen NEGATIVE NEGATIVE    Comment:        Infection due to S. pneumoniae cannot be absolutely ruled out since the antigen present may be below the detection limit of the test. Performed at [facility], [address] [place], [place] [address]   Culture, Urine     Status: None   Collection Time: 05/11/18 10:34 AM  Result Value Ref Range   Specimen Description URINE, CATHETERIZED    Special Requests Normal    Culture      NO GROWTH Performed at [facility] [address], [place], [place] [address]    Report Status 05/12/2018 FINAL   Glucose, capillary     Status: Abnormal   Collection Time: 05/11/18 10:58 AM  Result Value Ref Range   Glucose-Capillary  103 (H) 70 - 99 mg/dL  Glucose, capillary     Status: Abnormal   Collection Time: 05/11/18  3:18 PM  Result Value Ref Range   Glucose-Capillary 40 (LL) 70 - 99 mg/dL   Comment 1 Notify RN   Lactic acid, plasma     Status: None   Collection Time: 05/11/18  3:37 PM  Result Value Ref Range   Lactic Acid, Venous 1.5 0.5 - 1.9 mmol/L    Comment: Performed at [facility] [address] [place], [place] [address]  Glucose, capillary     Status: Abnormal   Collection Time: 05/11/18  3:43 PM  Result Value Ref Range   Glucose-Capillary 248 (H) 70 - 99 mg/dL  Magnesium     Status: Abnormal   Collection Time: 05/11/18  5:44 PM  Result Value Ref Range   Magnesium 2.5 (H) 1.7 - 2.4 mg/dL    Comment: Performed at [facility] [address], [place], [place] [address]  Phosphorus     Status: Abnormal   Collection Time: 05/11/18  5:44 PM  Result Value Ref Range   Phosphorus 1.7 (L) 2.5 - 4.6 mg/dL    Comment: Performed at [facility] [address] [place], [place] [address]  Glucose, capillary     Status: Abnormal   Collection Time: 05/11/18  7:46 PM  Result Value Ref Range   Glucose-Capillary 125 (H) 70 - 99 mg/dL  Glucose, capillary     Status: Abnormal   Collection Time: 05/12/18 12:27 AM  Result Value Ref Range   Glucose-Capillary 117 (H) 70 - 99 mg/dL  Glucose, capillary     Status: Abnormal   Collection Time: 05/12/18  3:20 AM  Result Value Ref Range   Glucose-Capillary 138 (H) 70 - 99 mg/dL  Vancomycin, trough     Status: None   Collection Time: 05/12/18  3:22 AM  Result Value Ref Range   Vancomycin Tr 15 15 - 20 ug/mL    Comment: Performed at [facility], [place] [address], [place], [place] [address]  CBC     Status: Abnormal   Collection Time: 05/12/18  3:22 AM  Result Value Ref Range   WBC 8.5 4.0 - 10.5 K/uL   RBC 3.38 (L) 4.22 - 5.81 MIL/uL   Hemoglobin 9.5 (L) 13.0 - 17.0 g/dL   HCT 30.9 (L) 39.0 - 52.0 %   MCV 91.4 80.0 - 100.0 fL   MCH  28.1 26.0 - 34.0 pg   MCHC 30.7 30.0 - 36.0 g/dL   RDW 13.9 11.5 - 15.5 %   Platelets 120 (L) 150 - 400 K/uL   nRBC 0.0 0.0 - 0.2 %    Comment: Performed at [facility], [place] [address], [place], [place] [address]  Comprehensive metabolic panel     Status: Abnormal   Collection Time: 05/12/18  3:22 AM  Result Value Ref Range   Sodium 135 135 - 145 mmol/L   Potassium 3.5 3.5 - 5.1 mmol/L   Chloride 108 98 - 111 mmol/L   CO2 22 22 - 32 mmol/L   Glucose, Bld 161 (H) 70 - 99 mg/dL   BUN 28 (H) 8 - 23 mg/dL   Creatinine, Ser 1.06 0.61 - 1.24 mg/dL   Calcium 6.9 (L) 8.9 - 10.3 mg/dL   Total Protein 4.0 (L) 6.5 - 8.1 g/dL   Albumin 2.1 (L) 3.5 - 5.0 g/dL   AST 45 (H) 15 - 41 U/L   ALT 43 0 - 44 U/L   Alkaline Phosphatase 30 (L) 38 - 126 U/L   Total Bilirubin 0.5 0.3 - 1.2 mg/dL   GFR calc non Af Amer >60 >60 mL/min   GFR calc Af Amer >60 >60 mL/min    Comment: (NOTE) The eGFR has been calculated using the CKD EPI equation. This calculation has not been validated in all clinical situations. eGFR's persistently <60 mL/min signify possible Chronic Kidney Disease.    Anion gap 5 5 - 15    Comment: Performed at [facility] [address] [place], [place] [address]  Procalcitonin     Status: None   Collection Time: 05/12/18  3:22 AM  Result Value Ref Range   Procalcitonin 3.00 ng/mL    Comment:        Interpretation: PCT > 2 ng/mL: Systemic infection (sepsis) is likely, unless other causes are known. (NOTE)       Sepsis PCT Algorithm           Lower Respiratory Tract  Infection PCT Algorithm    ----------------------------     ----------------------------         PCT < 0.25 ng/mL                PCT < 0.10 ng/mL         Strongly encourage             Strongly discourage   discontinuation of antibiotics    initiation of antibiotics    ----------------------------     -----------------------------       PCT 0.25 - 0.50 ng/mL             PCT 0.10 - 0.25 ng/mL               OR       >80% decrease in PCT            Discourage initiation of                                            antibiotics      Encourage discontinuation           of antibiotics    ----------------------------     -----------------------------         PCT >= 0.50 ng/mL              PCT 0.26 - 0.50 ng/mL               AND       <80% decrease in PCT              Encourage initiation of                                             antibiotics       Encourage continuation           of antibiotics    ----------------------------     -----------------------------        PCT >= 0.50 ng/mL                  PCT > 0.50 ng/mL               AND         increase in PCT                  Strongly encourage                                      initiation of antibiotics    Strongly encourage escalation           of antibiotics                                     -----------------------------                                           PCT <= 0.25 ng/mL  OR                                        > 80% decrease in PCT                                     Discontinue / Do not initiate                                             antibiotics Performed at [facility], [place] [address], [place], [place] [address]   Magnesium     Status: None   Collection Time: 05/12/18  3:22 AM  Result Value Ref Range   Magnesium 2.2 1.7 - 2.4 mg/dL    Comment: Performed at [facility], [place] [address] [place], [place] [address]  Phosphorus     Status: Abnormal   Collection Time: 05/12/18  3:22 AM  Result Value Ref Range   Phosphorus 1.5 (L) 2.5 - 4.6 mg/dL    Comment: Performed at [facility] [address] [place], [place] [address]  I-STAT 3, arterial blood gas (G3+)     Status: Abnormal   Collection Time: 05/12/18  4:32 AM  Result Value Ref Range   pH, Arterial 7.349 (L) 7.350 - 7.450   pCO2 arterial 36.3  32.0 - 48.0 mmHg   pO2, Arterial 89.0 83.0 - 108.0 mmHg   Bicarbonate 20.0 20.0 - 28.0 mmol/L   TCO2 21 (L) 22 - 32 mmol/L   O2 Saturation 96.0 %   Acid-base deficit 5.0 (H) 0.0 - 2.0 mmol/L   Patient temperature 98.6 F    Collection site RADIAL, ALLEN'S TEST ACCEPTABLE    Drawn by Operator    Sample type ARTERIAL   Glucose, capillary     Status: Abnormal   Collection Time: 05/12/18  7:43 AM  Result Value Ref Range   Glucose-Capillary 187 (H) 70 - 99 mg/dL  Glucose, capillary     Status: Abnormal   Collection Time: 05/12/18 11:36 AM  Result Value Ref Range   Glucose-Capillary 113 (H) 70 - 99 mg/dL  Troponin I Once     Status: Abnormal   Collection Time: 05/12/18 12:33 PM  Result Value Ref Range   Troponin I 0.56 (HH) <0.03 ng/mL    Comment: CRITICAL VALUE NOTED.  VALUE IS CONSISTENT WITH PREVIOUSLY REPORTED AND CALLED VALUE. Performed at [facility], [place] [address] [place], [place] [address]   Glucose, capillary     Status: Abnormal   Collection Time: 05/12/18  3:50 PM  Result Value Ref Range   Glucose-Capillary 132 (H) 70 - 99 mg/dL    Ct Head Wo Contrast  Result Date: 05/11/2018 CLINICAL DATA:  Follow up subdural hematoma. EXAM: CT HEAD WITHOUT CONTRAST TECHNIQUE: Contiguous axial images were obtained from the base of the skull through the vertex without intravenous contrast. COMPARISON:  CT HEAD May 09, 2018 FINDINGS: BRAIN: Dense LEFT parieto-occipital subdural hematoma with extension to the LEFT cerebellar tentorium decreased in size, 3 mm maximum transverse dimension. No intraparenchymal hemorrhage, mass effect nor midline shift. The ventricles and sulci are normal for age. Minimal supratentorial white matter hypodensities less than expected  for patient's age, though non-specific are most compatible with chronic small vessel ischemic disease. No acute large vascular territory infarcts. Basal cisterns are patent. VASCULAR: Mild calcific atherosclerosis of the carotid  siphons. SKULL: No skull fracture. No significant scalp soft tissue swelling. SINUSES/ORBITS: Trace paranasal sinus mucosal thickening. Mastoid air cells are well aerated.The included ocular globes and orbital contents are non-suspicious. Status post bilateral ocular lens implants. OTHER: Patient is edentulous.  Life support lines in place. IMPRESSION: 1. Small residual LEFT parieto-occipital to cerebellar tentorium acute subdural hematoma measuring to 3 mm. 2. Otherwise negative non-contrast CT HEAD for age. Electronically Signed   By: Elon Alas M.D.   On: 05/11/2018 14:59   Dg Chest Port 1 View  Result Date: 05/12/2018 CLINICAL DATA:  Acute respiratory failure. EXAM: PORTABLE CHEST 1 VIEW COMPARISON:  05/11/2018 and chest CT 05/24/2018 FINDINGS: Patient is rotated to the right. Endotracheal tube has tip 8 cm above the carina. Enteric tube courses into the region of the stomach and off the inferior portion of the film as tip is not visualized. Left IJ central venous catheter unchanged with tip over the SVC. Left-sided chest tube unchanged. Stable hazy infrahilar and bibasilar opacification. Possible air-fluid level over the medial right base/retrocardiac region likely fluid within an emphysematous bleb as seen on previous CT. No pneumothorax. Cardiomediastinal silhouette and remainder of the exam is unchanged. IMPRESSION: Stable hazy bilateral infrahilar/bibasilar opacification. Tubes and lines unchanged. Electronically Signed   By: Marin Olp M.D.   On: 05/12/2018 07:38   Dg Chest Port 1 View  Result Date: 05/11/2018 CLINICAL DATA:  Respiratory failure. EXAM: PORTABLE CHEST 1 VIEW COMPARISON:  05/11/2018 earlier. FINDINGS: Nasogastric tube and endotracheal tube as well as left IJ central venous catheter are unchanged. Left-sided chest tube unchanged. Lungs are adequately inflated demonstrate persistent hazy opacification over the mid to lower lungs. Stable left retrocardiac opacification. No  significant effusion. No pneumothorax. Emphysematous disease over the upper lobes. No pneumothorax. Cardiomediastinal silhouette and remainder of the exam is unchanged. IMPRESSION: Stable persistent hazy opacification over the right mid to lower lungs and stable left retrocardiac opacification. Emphysematous disease. Left-sided chest tube unchanged. No definite left pneumothorax visualized. Remaining tubes and lines unchanged. Electronically Signed   By: Marin Olp M.D.   On: 05/11/2018 08:38   Dg Chest Port 1 View  Result Date: 05/11/2018 CLINICAL DATA:  Air-leak EXAM: PORTABLE CHEST 1 VIEW COMPARISON:  05/10/2018 FINDINGS: Endotracheal tube tip measures 9.4 cm above the carina. Enteric tube tip is off the field of view but below the left hemidiaphragm. Left central venous catheter over the low SVC region. Left chest tube in place. Minimal residual left apical pneumothorax as before. Subcutaneous emphysema in the chest wall bilaterally. Also no change. Heart size and pulmonary vascularity are normal. Infiltration or edema in both mid and lower lung zones. Probable small bilateral pleural effusions. IMPRESSION: Appliances appear to be in satisfactory location. Minimal residual left apical pneumothorax. Left chest tube unchanged. Subcutaneous emphysema in the chest wall. Infiltration or edema in both mid and lower lung zones. Small bilateral pleural effusions. Electronically Signed   By: Lucienne Capers M.D.   On: 05/11/2018 01:07   Dg Chest Port 1 View  Result Date: 05/10/2018 CLINICAL DATA:  Respiratory failure. EXAM: PORTABLE CHEST 1 VIEW COMPARISON:  05/10/2018 at 4:48 a.m., and older exams. FINDINGS: Left chest tube is stable. Minimal apical pneumothorax is without change from the earlier exam. Left lateral chest wall subcutaneous emphysema is also unchanged. Lungs  demonstrate advanced emphysema. There is lung base opacity consistent with atelectasis, also unchanged from the earlier study. No new  lung abnormalities. Endotracheal tube, nasal/orogastric tube and left internal jugular central venous line are stable. IMPRESSION: 1. No change from the earlier exam. 2. Minimal left apical pneumothorax.  Stable left chest tube. 3. Persistent lower lung opacities consistent with atelectasis. Cannot exclude a component pneumonia. 4. Advanced emphysema. 5. Other support apparatus is stable and well positioned. Electronically Signed   By: Lajean Manes M.D.   On: 05/10/2018 17:36   CT CHEST, ABDOMEN AND PELVIS WITHOUT CONTRAST  TECHNIQUE: Multidetector CT imaging of the chest, abdomen and pelvis was performed following the standard protocol without IV contrast.  COMPARISON:  CT chest abdomen pelvis earlier in the day  FINDINGS: CT CHEST FINDINGS  Cardiovascular: The heart is within normal limits in size. No pericardial effusion is seen. Coronary artery calcifications are noted diffusely but primarily in the left anterior descending coronary artery. Moderate thoracic aortic atherosclerosis is present. The mid ascending thoracic aorta measures 34 mm in diameter. The pulmonary artery segments are prominent most indicative of a degree of pulmonary arterial hypertension.  Mediastinum/Nodes: No mediastinal or hilar adenopathy is seen. Endotracheal tube is present. Thyroid gland is unremarkable. NG tube also extends into the stomach.  Lungs/Pleura: There has been some decrease in the overall volume of the left pneumothorax with left chest tube present. Severe emphysematous changes are noted throughout the lungs with chronic interstitial change as well. Bibasilar opacities are present with pleural effusions as well as airspace disease with air bronchograms noted. These findings are most consistent with bilateral lower lobe pneumonia with pleural effusions.  Musculoskeletal: The thoracic vertebrae are normal alignment. The sternum is intact. Subcutaneous emphysema is present  bilaterally left-greater-than-right and considerable free intraperitoneal air is noted.  CT ABDOMEN PELVIS FINDINGS 05/29/2018  Hepatobiliary: The liver is unremarkable in the unenhanced state. Surgical clips are present from prior cholecystectomy.  Pancreas: The pancreas is normal in size and the pancreatic duct is not dilated.  Spleen: The spleen is unremarkable.  Adrenals/Urinary Tract: The adrenal glands appear normal. The kidneys show some contrast excretion from the study earlier today but no hydronephrosis or mass is seen. 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 The ureters appear normal in caliber. The urinary bladder is decompressed with Foley catheter and difficult to assess. Contrast does enter the nondistended urinary bladder. The urinary bladder wall is somewhat thickened probably due to lack of distension.  Stomach/Bowel: The stomach is filled with fluid and and NG tube is present. The tip of the NG tube is noted to push against the anterior gastric wall distally and could be withdrawn somewhat. No small bowel distention is seen. There is a large amount of pneumoperitoneum present which most likely has dissected down from the patient's left-sided pneumothorax. No other evidence of perforation is seen. There is some scan contrast scattered throughout the nondistended colon. Terminal ileum is unremarkable.  Vascular/Lymphatic: The abdominal aorta is normal in caliber with moderate abdominal aortic atherosclerosis noted. No adenopathy is seen.  Reproductive: The prostate is prominent measuring 4.2 x 4.6 cm.  Other: As noted above there is a large amount of subcutaneous emphysema and pneumoperitoneum. No obvious source is seen other than the left pneumothorax, and much of this air may well have dissected caudally.  Musculoskeletal: The lumbar vertebrae are normal alignment with normal disc spaces. Both hips are normal in position. The SI  joints appear corticated.  IMPRESSION:  1. Large amount of pneumoperitoneum and subcutaneous air remains without obvious source within the abdomen or pelvis. This air may well have dissected caudally as a result of the patient's left pneumothorax. 2. Some decrease in volume of the left sided pneumothorax with left chest tube remaining. 3. NG tube tip abuts the distal antral wall of the stomach anteriorly and could be withdrawn somewhat. 4. Severe emphysematous change. 5. Probable bilateral lower lobe pneumonia with bilateral pleural effusions. 6. Diffuse coronary artery calcifications with moderate thoracic and abdominal aortic atherosclerosis.   Electronically Signed   By: Ivar Drape M.D.   On: 05/10/2018 08:13  I personally reviewed the CT chest and concur with the findings noted above  Review of Systems  Unable to perform ROS: Intubated   Blood pressure (!) 96/55, pulse 89, temperature 100.2 F (37.9 C), resp. rate 15, height _0  (1.727 m), weight 76.3 kg, SpO2 96 %. Physical Exam  Vitals reviewed. Constitutional:  intubated  Neck: No tracheal deviation present.  Cardiovascular: Normal rate and regular rhythm.  Distant heart sounds  Respiratory:  Chest tube noise on left. Subcutaneous emphysema  GI: Soft. He exhibits no distension.  Neurological:  Sedated  Skin: Skin is warm and dry.    Assessment/Plan: 72 yo man with severe emphysema who presented to [facility] with a tension pneumothorax in the setting of septic shock. Suffered a cardiac arrest and was intubated. ROSC after 12 minutes. A chest tube was placed.   Currently he has a moderate air leak which is no surprise. It is unlikely to stop while he is mechanically ventilated. In my opinion the air leak is not large enough to keep him from weaning from the vent. Would keep CT to suction.  He has end stage COPD and is not a good candidate for VATS to treat the air leak. If it becomes prolonged we  could consider a trial of off label IBV placement.  Melrose Nakayama 05/12/2018, 4:59 PM

## 2018-05-13 ENCOUNTER — Inpatient Hospital Stay (HOSPITAL_COMMUNITY): Payer: Medicare Other

## 2018-05-13 LAB — GLUCOSE, CAPILLARY
GLUCOSE-CAPILLARY: 134 mg/dL — AB (ref 70–99)
GLUCOSE-CAPILLARY: 159 mg/dL — AB (ref 70–99)
GLUCOSE-CAPILLARY: 161 mg/dL — AB (ref 70–99)
GLUCOSE-CAPILLARY: 165 mg/dL — AB (ref 70–99)
Glucose-Capillary: 160 mg/dL — ABNORMAL HIGH (ref 70–99)
Glucose-Capillary: 133 mg/dL — ABNORMAL HIGH (ref 70–99)
Glucose-Capillary: 159 mg/dL — ABNORMAL HIGH (ref 70–99)

## 2018-05-13 LAB — BASIC METABOLIC PANEL
ANION GAP: 5 (ref 5–15)
BUN: 39 mg/dL — ABNORMAL HIGH (ref 8–23)
CALCIUM: 7.2 mg/dL — AB (ref 8.9–10.3)
CHLORIDE: 111 mmol/L (ref 98–111)
CO2: 21 mmol/L — AB (ref 22–32)
Creatinine, Ser: 1.1 mg/dL (ref 0.61–1.24)
GFR calc Af Amer: >60 mL/min (ref >60)
GFR calc non Af Amer: >60 mL/min (ref >60)
GLUCOSE: 166 mg/dL — AB (ref 70–99)
POTASSIUM: 4.2 mmol/L (ref 3.5–5.1)
Sodium: 137 mmol/L (ref 135–145)

## 2018-05-13 LAB — CBC
HEMATOCRIT: 30.9 % — AB (ref 39.0–52.0)
HEMOGLOBIN: 9.2 g/dL — AB (ref 13.0–17.0)
MCH: 27.9 pg (ref 26.0–34.0)
MCHC: 29.8 g/dL — ABNORMAL LOW (ref 30.0–36.0)
MCV: 93.6 fL (ref 80.0–100.0)
Platelets: 127 10*3/uL — ABNORMAL LOW (ref 150–400)
RBC: 3.3 MIL/uL — AB (ref 4.22–5.81)
RDW: 14.6 % (ref 11.5–15.5)
WBC: 9.8 10*3/uL (ref 4.0–10.5)
nRBC: 0 % (ref 0.0–0.2)

## 2018-05-13 LAB — MAGNESIUM: Magnesium: 2.2 mg/dL (ref 1.7–2.4)

## 2018-05-13 LAB — PHOSPHORUS: Phosphorus: 1.9 mg/dL — ABNORMAL LOW (ref 2.5–4.6)

## 2018-05-13 LAB — TROPONIN I: Troponin I: 0.38 ng/mL (ref <0.03)

## 2018-05-13 MED ORDER — SODIUM PHOSPHATES 45 MMOLE/15ML IV SOLN
20.0000 mmol | Freq: Once | INTRAVENOUS | Status: AC
  Administered 2018-05-13: 20 mmol via INTRAVENOUS
  Filled 2018-05-13: qty 6.67

## 2018-05-13 NOTE — Progress Notes (Signed)
  Subjective: Intubated, sedated  Objective: Vital signs in last 24 hours: Temp:  [97.9 F (36.6 C)-100.4 F (38 C)] 100.2 F (37.9 C) (11/10 1100) Pulse Rate:  [78-144] 99 (11/10 1100) Cardiac Rhythm: Sinus tachycardia (11/10 0800) Resp:  [13-35] 17 (11/10 1100) BP: (83-173)/(51-104) 115/59 (11/10 1100) SpO2:  [87 %-100 %] 100 % (11/10 1126) FiO2 (%):  [40 %] 40 % (11/10 1119) Weight:  [77.7 kg] 77.7 kg (11/10 0500)  Hemodynamic parameters for last 24 hours: CVP:  [16 mmHg] 16 mmHg  Intake/Output from previous day: 11/09 0701 - 11/10 0700 In: 5777.7 [I.V.:2938.9; NG/GT:1625; IV Piggyback:1213.8] Out: 1190 [Urine:960; Chest Tube:230] Intake/Output this shift: Total I/O In: 749.5 [I.V.:508.2; Other:20; IV Piggyback:221.3] Out: 270 [Urine:240; Chest Tube:30]  General appearance: intubated Lungs: wheezes bilaterally + air leak  Lab Results: Recent Labs    05/12/18 0322 05/13/18 0341  WBC 8.5 9.8  HGB 9.5* 9.2*  HCT 30.9* 30.9*  PLT 120* 127*   BMET:  Recent Labs    05/12/18 0322 05/13/18 0341  NA 135 137  K 3.5 4.2  CL 108 111  CO2 22 21*  GLUCOSE 161* 166*  BUN 28* 39*  CREATININE 1.06 1.10  CALCIUM 6.9* 7.2*    PT/INR: No results for input(s): LABPROT, INR in the last 72 hours. ABG    Component Value Date/Time   PHART 7.349 (L) 05/12/2018 0432   HCO3 20.0 05/12/2018 0432   TCO2 21 (L) 05/12/2018 0432   ACIDBASEDEF 5.0 (H) 05/12/2018 0432   O2SAT 96.0 05/12/2018 0432   CBG (last 3)  Recent Labs    05/13/18 0005 05/13/18 0321 05/13/18 0734  GLUCAP 134* 161* 165*    Assessment/Plan: S/P  -No change in air leak Wheezing on exam- on bronchodilators and steroids Keep CT to suction   LOS: 4 days    Loreli Slot 05/13/2018

## 2018-05-13 NOTE — Progress Notes (Signed)
eLink Physician-Brief Progress Note Patient Name: Julian Fernandez DOB: [birth year] MRN: [id number]   Date of Service  05/13/2018  HPI/Events of Note  PO4--- = 1.9 and Creatinine = 1.10.   eICU Interventions  Will replace PO4---.     Intervention Category Major Interventions: Electrolyte abnormality - evaluation and management  Sommer,Steven Eugene 05/13/2018, 6:25 AM

## 2018-05-13 NOTE — Progress Notes (Signed)
NAME:  Julian Fernandez, MRN:  [id number], DOB:  Jul 05, 1945, LOS: 4 ADMISSION DATE:  05/31/2018, CONSULTATION DATE:  05-31-2018 REFERRING MD:  [facility], CHIEF COMPLAINT:  Cardiac arrest   Brief History   72 yo M with PMH significant for chronic hypoxic respiratory failure on 4L O2 and COPD presented to [facility] from [place] with SOB and AMS.  Found to have left tension pneumothorax.  Had 12 min cardiac arrest with intubation and CT placement.  Pt with progressive shock and new mass pneumoperitoneum.  Recent hospitalization 2 weeks ago for AECOPD and HCAP.   Past Medical History  COPD, chronic hypoxic respiratory failure   Significant Hospital Events   11/6 tx to [facility] from [place]/ cardiac arrest   Consults: date of consult/date signed off & final recs:  CVTS 11/9>   Procedures (surgical and bedside):  11/6 ETT >> 11/6 Left 30 f CT >> 11/6 L IJ TL >> 11/6 foley >>  Significant Diagnostic Tests:  11/6 CT Chest >> Left CT in good position with residual 25% pnx.  Massive pneumoperitoneum, most likely dissected own from the pneumothorax.  There is also extensive aire in the abdominal wall muscles.  No free fluid or obvious bowel injury.  Severe emphysema.  Bilateral pleural effusions.  CT Head 11/6 >> 3 mm thickness posterior left convexity subdural hematoma. No midline shift or other mass effect.  Echo 11/7>>Completely unable to visualize heart, even with attempt at using   echo contrast (definity). This may be due to air interface, such   as with pneumothorax or severe emphysema. Unable to comment on   any cardiac structures.   Micro Data:  11/6 BC x 2 >> 11/8 trach asp >> 11/8 Urine Culture , Strep/ legionella >>   Antimicrobials:  11/6 vanc >> 11/9 11/6 Cefepime >> 11/6 Flagyl >>  Subjective:  Off pressors, unresponsive Continues to have a large air leak   Objective   Blood pressure (!) 115/59, pulse 99, temperature 100.2 F (37.9 C), resp. rate 17, height 5\' 8"  (1.727 m),  weight 77.7 kg, SpO2 100 %.    Vent Mode: PRVC FiO2 (%):  [40 %] 40 % Set Rate:  [34 bmp] 34 bmp Vt Set:  [410 mL] 410 mL PEEP:  [5 cmH20] 5 cmH20 Pressure Support:  [8 cmH20] 8 cmH20 Plateau Pressure:  [20 cmH20-27 cmH20] 22 cmH20   Intake/Output Summary (Last 24 hours) at 05/13/2018 1530 Last data filed at 05/13/2018 1508 Gross per 24 hour  Intake 5108.05 ml  Output 1280 ml  Net 3828.05 ml   Filed Weights   05/11/18 0500 05/12/18 0500 05/13/18 0500  Weight: 69.4 kg 76.3 kg 77.7 kg    Examination: Gen:      No acute distress HEENT:  EOMI, sclera anicteric ET tube Neck:     No masses; no thyromegaly  Lungs:    Clear to auscultation bilaterally; normal respiratory effort, left chest tube with persistent air leak. CV:         Regular rate and rhythm; no murmurs Abd:      + bowel sounds; soft, non-tender; no palpable masses, no distension Ext:    No edema; adequate peripheral perfusion Skin:      Warm and dry; no rash Neuro: Sedated, does not follow commands.  Resolved Hospital Problem list    Assessment & Plan:  Acute on chronic hypoxic respiratory failure - multifactorial related to Left tension pneumothorax, AECOPD, +/- HCAP, cardiac arrest  Vent dyssynchrony  Pneumoperitoneum - unclear  etiology- ? 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 Solu-Medrol 40 mg every 12.  Cardiac Arrest presumed due to left tension pneumothorax but can not rule out other etiologies at this time Shock- septic vs cardiogenic  Elevated troponin without acute ST changes  - 12 mins prior to ROSC and was f/c at [facility], sedated for vent compliance P:  Telemetry monitoring.  ID CXR with opacification No Sputum or  Urine Cx PCT 11/6>> 8.32 WBC 21.2 T Max 100.9 Plan Follow cultures Off vancomycin Continue cefepime and Flagyl for now.  Acute encephalopathy after cardiac arrest and ? New unequal pupils  Small Sub Dural on CT head ?  Seizure-like activity, concern for anoxic injury. P:  Small subdural CT of the head > stable on repeat Plan: Monitor subdural.  No interventions per neurosurgery Get MRI head to assess anoxic injury. EEG to assess seizures.   AKI, hypophosphatemia Monitor renal function Replete electrolytes  Endocrine Plan CBG Q 4 SSI  Nutrition Plan Continue tube feeds   Disposition / Summary of Today's Plan 05/13/18   MRI to assess neuro status Wake-up assessments,    Diet: Initiate TF Pain/Anxiety/Delirium protocol (if indicated): fentanyl gtt, prn versed for vent synchrony, will add precedex VAP protocol (if indicated): yes DVT prophylaxis: SCDs small subdural hematoma on CT scan GI prophylaxis: pepcid Hyperglycemia protocol: SSI  Mobility: bed rest  Code Status: Full  Family Communication: no family at bedside.  Officer remains at bedside.   The patient is critically ill with multiple organ system failure and requires high complexity decision making for assessment and support, frequent evaluation and titration of therapies, advanced monitoring, review of radiographic studies and interpretation of complex data.   Critical Care Time devoted to patient care services, exclusive of separately billable procedures, described in this note is 35 minutes.   Chilton Greathouse MD [facility] Pager [phone] If no answer or after 3pm call: [phone] 05/13/2018, 3:36 PM

## 2018-05-14 ENCOUNTER — Inpatient Hospital Stay (HOSPITAL_COMMUNITY): Payer: Medicare Other

## 2018-05-14 DIAGNOSIS — I469 Cardiac arrest, cause unspecified: Secondary | ICD-10-CM

## 2018-05-14 LAB — BASIC METABOLIC PANEL
Anion gap: 4 — ABNORMAL LOW (ref 5–15)
BUN: 38 mg/dL — AB (ref 8–23)
CALCIUM: 7.5 mg/dL — AB (ref 8.9–10.3)
CO2: 25 mmol/L (ref 22–32)
CREATININE: 1.06 mg/dL (ref 0.61–1.24)
Chloride: 111 mmol/L (ref 98–111)
GFR calc Af Amer: >60 mL/min (ref >60)
GFR calc non Af Amer: >60 mL/min (ref >60)
Glucose, Bld: 174 mg/dL — ABNORMAL HIGH (ref 70–99)
Potassium: 4.9 mmol/L (ref 3.5–5.1)
Sodium: 140 mmol/L (ref 135–145)

## 2018-05-14 LAB — GLUCOSE, CAPILLARY
GLUCOSE-CAPILLARY: 159 mg/dL — AB (ref 70–99)
GLUCOSE-CAPILLARY: 158 mg/dL — AB (ref 70–99)
Glucose-Capillary: 161 mg/dL — ABNORMAL HIGH (ref 70–99)
Glucose-Capillary: 173 mg/dL — ABNORMAL HIGH (ref 70–99)
Glucose-Capillary: 133 mg/dL — ABNORMAL HIGH (ref 70–99)

## 2018-05-14 LAB — CULTURE, RESPIRATORY W GRAM STAIN: Gram Stain: NONE SEEN

## 2018-05-14 LAB — CBC
HCT: 36.7 % — ABNORMAL LOW (ref 39.0–52.0)
Hemoglobin: 11.2 g/dL — ABNORMAL LOW (ref 13.0–17.0)
MCH: 28.6 pg (ref 26.0–34.0)
MCHC: 30.5 g/dL (ref 30.0–36.0)
MCV: 93.6 fL (ref 80.0–100.0)
Platelets: 156 10*3/uL (ref 150–400)
RBC: 3.92 MIL/uL — ABNORMAL LOW (ref 4.22–5.81)
RDW: 15.4 % (ref 11.5–15.5)
WBC: 15.1 10*3/uL — ABNORMAL HIGH (ref 4.0–10.5)
nRBC: 0 % (ref 0.0–0.2)

## 2018-05-14 LAB — CULTURE, RESPIRATORY

## 2018-05-14 LAB — CULTURE, BLOOD (ROUTINE X 2)
Culture: NO GROWTH
Culture: NO GROWTH
Special Requests: ADEQUATE
Special Requests: ADEQUATE

## 2018-05-14 LAB — LEGIONELLA PNEUMOPHILA SEROGP 1 UR AG: L. pneumophila Serogp 1 Ur Ag: NEGATIVE

## 2018-05-14 MED ORDER — GADOBUTROL 1 MMOL/ML IV SOLN
7.0000 mL | Freq: Once | INTRAVENOUS | Status: AC | PRN
  Administered 2018-05-14: 7 mL via INTRAVENOUS

## 2018-05-14 MED ORDER — METHYLPREDNISOLONE SODIUM SUCC 40 MG IJ SOLR
40.0000 mg | Freq: Every day | INTRAMUSCULAR | Status: DC
  Administered 2018-05-15 – 2018-05-17 (×3): 40 mg via INTRAVENOUS
  Filled 2018-05-14 (×3): qty 1

## 2018-05-14 NOTE — Procedures (Signed)
ELECTROENCEPHALOGRAM REPORT   Patient: Julian Fernandez       Room #: 3M10C EEG No. ID: [id number] Age: 72 y.o.        Sex: male Referring Physician: Mannam Report Date:  05/14/2018        Interpreting Physician: Thana Farr  History: [name] is an 72 y.o. male s/p arrest  Medications:  Levophed, Precedex, Fentanyl, Vasopressin, Pepcid, Insulin, Solumedrol, Flagyl  Conditions of Recording:  This is a 21 channel routine scalp EEG performed with bipolar and monopolar montages arranged in accordance to the international 10/20 system of electrode placement. One channel was dedicated to EKG recording.  The patient is in the intubated and sedated state.  Description:  The background activity is slow and poorly organized.  It consists of a mixture of low voltage polymorphic delta activity and theta activity.  This slow activity is diffusely distributed and continuous throughout the recording.   No epileptiform activity is noted.  Hyperventilation and intermittent photic stimulation were not performed.   IMPRESSION: This is an abnormal EEG secondary to general background slowing.  This finding may be seen with a diffuse disturbance that is etiologically nonspecific, but may include a metabolic encephalopathy or medication effect, among other possibilities.  No epileptiform activity was noted.     Thana Farr, MD Neurology [phone] 05/14/2018, 11:02 AM

## 2018-05-14 NOTE — Progress Notes (Signed)
NAME:  Julian Fernandez, MRN:  [id number], DOB:  1946/01/05, LOS: 5 ADMISSION DATE:  05/12/2018, CONSULTATION DATE:  05/25/2018 REFERRING MD:  [facility], CHIEF COMPLAINT:  Cardiac arrest   Brief History   72 yo M with PMH significant for chronic hypoxic respiratory failure on 4L O2 and COPD presented to [facility] from [place] with SOB and AMS.  Found to have left tension pneumothorax.  Had 12 min cardiac arrest with intubation and CT placement.  Pt with progressive shock and new mass pneumoperitoneum.  Recent hospitalization 2 weeks ago for AECOPD and HCAP.   Past Medical History  COPD, chronic hypoxic respiratory failure   Significant Hospital Events   11/6 tx to [facility] from [place]/ cardiac arrest   Consults: date of consult/date signed off & final recs:  CVTS 11/9>   Procedures (surgical and bedside):  11/6 ETT >> 11/6 Left 30 f CT >> 11/6 L IJ TL >> 11/6 foley >>  Significant Diagnostic Tests:  11/6 CT Chest >> Left CT in good position with residual 25% pnx.  Massive pneumoperitoneum, most likely dissected own from the pneumothorax.  There is also extensive aire in the abdominal wall muscles.  No free fluid or obvious bowel injury.  Severe emphysema.  Bilateral pleural effusions.  CT Head 11/6 >> 3 mm thickness posterior left convexity subdural hematoma. No midline shift or other mass effect.  Echo 11/7>>Completely unable to visualize heart, even with attempt at using   echo contrast (definity). This may be due to air interface, such   as with pneumothorax or severe emphysema. Unable to comment on   any cardiac structures.  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 rate (!) 35, height 5\' 8"  (1.727 m), weight 80.3 kg, SpO2 95 %. CVP:  [17 mmHg] 17 mmHg  Vent Mode: PRVC FiO2 (%):  [40 %] 40 % Set Rate:  [34 bmp] 34 bmp Vt Set:  [410 mL] 410 mL PEEP:  [5 cmH20] 5 cmH20 Plateau Pressure:  [18 cmH20-32 cmH20] 25 cmH20   Intake/Output Summary (Last 24 hours) at 05/14/2018 1150 Last data filed at 05/14/2018 1000 Gross per 24 hour  Intake 3397.09 ml  Output 1640 ml  Net 1757.09 ml   Filed Weights   05/12/18 0500 05/13/18 0500 05/14/18 0400  Weight: 76.3 kg 77.7 kg 80.3 kg    Examination:  General:  In bed on vent HENT: NCAT ETT in place PULM: CTA B, vent supported breathing CV: RRR, no mgr GI: BS+, soft, nontender MSK: normal bulk and tone Neuro: sedated on vent    Resolved Hospital Problem list    Assessment & Plan:  Acute on chronic hypoxic respiratory failure - multifactorial related to Left tension pneumothorax, AECOPD, +/- HCAP, cardiac arrest  Severe airflow obstruction Not a candidate for repair of air leak Overall prognosis dismal given severity of acute respiratory failure and severity of baseline limitation from COPD P:  Wean down solumedrol Continue bronchodilators: brovana/pulmicort Full mechanical  vent support VAP prevention Daily WUA/SBT Continue chest tube to suction Stop flagyl Continue cefepime for today, then stop  Cardiac Arrest presumed due to left tension pneumothorax Cardiogenic shock from air trapping: resolved P:  Telemetry monitoring, monitor off of vasopressors  Acute encephalopathy after cardiac arrest and ? New unequal pupils  Small Sub Dural on CT head ?  Seizure-like activity, concern for anoxic injury. P:  Small subdural CT of the head > stable on repeat Plan: No intervention necessary Monitor neurologic  status   AKI, hypophosphatemia Monitor BMET and UOP Replace electrolytes as needed   Endocrine Plan CBG q4h SSI  Nutrition Plan Continue tube feeding  Demand ischemia: Plan: Continue supportive care   Disposition / Summary of Today's Plan 05/14/18   Prognosis poor, will need to discuss goals of care with warden    Diet: Initiate TF Pain/Anxiety/Delirium protocol (if indicated) fentanyl/precedex per PAD protocol VAP protocol (if indicated): yes DVT prophylaxis: SCDs small subdural hematoma on CT scan GI prophylaxis: pepcid Hyperglycemia protocol: SSI  Mobility: bed rest  Code Status: Full  Family Communication: no family at bedside.  Officer remains at bedside.   The patient is critically ill with multiple organ system failure and requires high complexity decision making for assessment and support, frequent evaluation and titration of therapies, advanced monitoring, review of radiographic studies and interpretation of complex data.   Critical Care Time devoted to patient care services, exclusive of separately billable procedures, described in this note is 35 minutes.   Heber Ontario, MD [facility] Pager: [phone] Cell: [phone] After 3pm or if no response, call [phone]  05/14/2018, 11:50 AM

## 2018-05-14 NOTE — Progress Notes (Signed)
eLink Physician-Brief Progress Note Patient Name: Julian Fernandez DOB: 07-Jul-1945 MRN: [id number]   Date of Service  05/14/2018  HPI/Events of Note  L Pneumothorax - CXR reveals: Tiny left apical pneumothorax.  Left-sided chest tube.  eICU Interventions  Continue present management. Repeat portable CXR at 8 AM.     Intervention Category Major Interventions: Other:  Lenell Antu 05/14/2018, 8:57 PM

## 2018-05-14 NOTE — Progress Notes (Signed)
eLink Physician-Brief Progress Note Patient Name: Julian Fernandez DOB: September 11, 1945 MRN: [id number]   Date of Service  05/14/2018  HPI/Events of Note  Nursing concerned about increased air leak from chest tube. Request for repeat CXR.   eICU Interventions  Will order portable CXR STAT.      Intervention Category Major Interventions: Other:  Lenell Antu 05/14/2018, 8:09 PM

## 2018-05-14 NOTE — Progress Notes (Signed)
EEG completed, results pending. 

## 2018-05-15 ENCOUNTER — Inpatient Hospital Stay (HOSPITAL_COMMUNITY): Payer: Medicare Other

## 2018-05-15 LAB — GLUCOSE, CAPILLARY
GLUCOSE-CAPILLARY: 142 mg/dL — AB (ref 70–99)
Glucose-Capillary: 118 mg/dL — ABNORMAL HIGH (ref 70–99)
Glucose-Capillary: 140 mg/dL — ABNORMAL HIGH (ref 70–99)
Glucose-Capillary: 146 mg/dL — ABNORMAL HIGH (ref 70–99)
Glucose-Capillary: 151 mg/dL — ABNORMAL HIGH (ref 70–99)
Glucose-Capillary: 153 mg/dL — ABNORMAL HIGH (ref 70–99)
Glucose-Capillary: 160 mg/dL — ABNORMAL HIGH (ref 70–99)

## 2018-05-15 LAB — BASIC METABOLIC PANEL
Anion gap: 5 (ref 5–15)
BUN: 40 mg/dL — AB (ref 8–23)
CALCIUM: 7.7 mg/dL — AB (ref 8.9–10.3)
CO2: 27 mmol/L (ref 22–32)
Chloride: 111 mmol/L (ref 98–111)
Creatinine, Ser: 0.97 mg/dL (ref 0.61–1.24)
GFR calc Af Amer: >60 mL/min (ref >60)
GLUCOSE: 169 mg/dL — AB (ref 70–99)
Potassium: 4.4 mmol/L (ref 3.5–5.1)
Sodium: 143 mmol/L (ref 135–145)

## 2018-05-15 LAB — POCT I-STAT 3, ART BLOOD GAS (G3+)
Bicarbonate: 25.2 mmol/L (ref 20.0–28.0)
O2 SAT: 93 %
Patient temperature: 98.6
TCO2: 26 mmol/L (ref 22–32)
pCO2 arterial: 42.5 mmHg (ref 32.0–48.0)
pH, Arterial: 7.381 (ref 7.350–7.450)
pO2, Arterial: 69 mmHg — ABNORMAL LOW (ref 83.0–108.0)

## 2018-05-15 MED ORDER — MIDAZOLAM HCL 2 MG/2ML IJ SOLN
4.0000 mg | Freq: Once | INTRAMUSCULAR | Status: AC
  Administered 2018-05-15: 4 mg via INTRAVENOUS

## 2018-05-15 MED ORDER — VECURONIUM BROMIDE 10 MG IV SOLR
8.0000 mg | Freq: Once | INTRAVENOUS | Status: AC
  Administered 2018-05-15: 8 mg via INTRAVENOUS
  Filled 2018-05-15: qty 10

## 2018-05-15 MED ORDER — SODIUM CHLORIDE 0.9 % IV BOLUS
500.0000 mL | Freq: Once | INTRAVENOUS | Status: AC
  Administered 2018-05-15: 500 mL via INTRAVENOUS

## 2018-05-15 MED ORDER — MIDAZOLAM HCL 2 MG/2ML IJ SOLN
INTRAMUSCULAR | Status: AC
  Administered 2018-05-15: 4 mg via INTRAVENOUS
  Filled 2018-05-15: qty 4

## 2018-05-15 NOTE — Progress Notes (Addendum)
NAME:  Julian Fernandez, MRN:  [id number], DOB:  [birth year], LOS: 6 ADMISSION DATE:  05/17/2018, CONSULTATION DATE:  05/16/2018 REFERRING MD:  [facility], CHIEF COMPLAINT:  Cardiac arrest   Brief History   72 yo M with PMH significant for chronic hypoxic respiratory failure on 4L O2 and COPD presented to [facility] from [place] with SOB and AMS.  Found to have left tension pneumothorax.  Had 12 min cardiac arrest with intubation and CT placement.  Pt with progressive shock and new mass pneumoperitoneum.  Recent hospitalization 2 weeks ago for AECOPD and HCAP.   Past Medical History  COPD, chronic hypoxic respiratory failure   Significant Hospital Events   11/6 tx to [facility] from [place]/ cardiac arrest   Consults: date of consult/date signed off & final recs:  CVTS 11/9>   Procedures (surgical and bedside):  11/6 ETT >> 11/6 Left 30 f CT >> 11/6 L IJ TL >> 11/6 foley >>  Significant Diagnostic Tests:  11/6 CT Chest >> Left CT in good position with residual 25% pnx.  Massive pneumoperitoneum, most likely dissected own from the pneumothorax.  There is also extensive aire in the abdominal wall muscles.  No free fluid or obvious bowel injury.  Severe emphysema.  Bilateral pleural effusions.  CT Head 11/6 >> 3 mm thickness posterior left convexity subdural hematoma. No midline shift or other mass effect.  Echo 11/7>>Completely unable to visualize heart, even with attempt at using   echo contrast (definity). This may be due to air interface, such   as with pneumothorax or severe emphysema. Unable to comment on   any cardiac structures.  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 rate (Abnormal) 36, height 5\' 8"  (1.727 m), weight 81.2 kg, SpO2 95 %.    Vent Mode: PRVC FiO2 (%):  [40 %] 40 % Set Rate:  [34 bmp] 34 bmp Vt Set:  [410 mL] 410 mL PEEP:  [5 cmH20] 5 cmH20 Plateau Pressure:  [17 cmH20-26 cmH20] 20 cmH20   Intake/Output Summary (Last 24 hours) at 05/15/2018 1105 Last data filed at 05/15/2018 1000 Gross per 24 hour  Intake 3493.64 ml  Output 2325 ml  Net 1168.64 ml   Filed Weights   05/13/18 0500 05/14/18 0400 05/15/18 0453  Weight: 77.7 kg 80.3 kg 81.2 kg    Examination:  General: 72 year old chronically ill-appearing male currently on full ventilator support he continues to exhibit accessory use even on assisted breath HEENT: Normocephalic atraumatic orally intubated Pulmonary: Prolonged expiratory wheeze throughout, 7 out of 7 airleak on left turbulent leak noted Cardiac: Regular rate and rhythm Abdomen: Soft nontender Extremities: Warm and dry brisk capillary refill GU: Clear yellow Neuro: Sedated   Resolved Hospital Problem list   AKI  Assessment & Plan:  Acute on chronic hypoxic respiratory failure/failure to wean- multifactorial related to Left tension pneumothorax, AECOPD, +/- HCAP, and cardiac arrest. He still has marked air trapping and wide open  Broncho-pleural fistula  PCXR ETT good position, left CT satisfactory. Bibasilar airspace disease persists. Small apical PTX.  Very concerned that he may not come off vent.  Plan Cont current CT at 20 cmH2O suction Cont full vent support  Cont BDs No change in BD Cont current solumedrol dosing.  Completed 6 days of antibiotics here at [facility]  Cardiac Arrest presumed due to left tension pneumothorax Cardiogenic shock from air trapping:  resolved Plan Cont tele monitoring   Acute encephalopathy after cardiac arrest and ? New unequal pupils  Small Sub Dural on CT head (stable on repeat imaging) ?  Seizure-like activity, concern for anoxic injury. Plan: Supportive care PAD goal: -2  Intermittent fluid and electrolyte imbalance Plan Trend chemistry and replace as indicated  Hyperglycemia  Plan ssi   Protein calorie malnutrition Plan  Cont tube feeds  Demand ischemia: Plan: Cont supportive care    Disposition / Summary of Today's Plan 05/15/18   Made him DNR Doubt he can come off vent.  May need to consider comfort.     Diet: Initiate TF Pain/Anxiety/Delirium protocol (if indicated) fentanyl/precedex per PAD protocol VAP protocol (if indicated): yes DVT prophylaxis: SCDs small subdural hematoma on CT scan GI prophylaxis: pepcid Hyperglycemia protocol: SSI  Mobility: bed rest  Code Status: Full, made DNR 11/12 Family Communication: no family at bedside.  Officer remains at bedside.   Simonne MartinetPeter E Dalani Mette ACNP-BC [facility] Pager # [phone] OR # [phone] if no answer   05/15/2018, 11:05 AM

## 2018-05-15 NOTE — Care Management Note (Signed)
Case Management Note Donn PieriniKristi Alia Parsley RN,BSN Transitions of Care Unit 38M - RN Case Manager [phone]  Patient Details  Name: Julian CaterJames Fernandez MRN: [id number] Date of Birth: 02/19/1946  Subjective/Objective:    [facility] -guard at [facility])  Pt s/p cardiac arrest, found to have tension pntx- chest tube placed- pt intubated and remains on vent- 11/12- Acute on chronic hypoxic respiratory failure/failure to wean- concern for anoxic injury.                Action/Plan: PTA pt was in prison- guard is at the bedside- CM to follow for transition of care-    Expected Discharge Date:                  Expected Discharge Plan:  Corrections Facility  In-House Referral:  Clinical Social Work  Discharge planning Services  CM Consult  Post Acute Care Choice:    Choice offered to:     DME Arranged:    DME Agency:     HH Arranged:    HH Agency:     Status of Service:  In process, will continue to follow  If discussed at Long Length of Stay Meetings, dates discussed:    Discharge Disposition:   Additional Comments:  Darrold SpanWebster, Munachimso Rigdon Hall, RN 05/15/2018, 1:36 PM

## 2018-05-15 NOTE — Progress Notes (Signed)
eLink Physician-Brief Progress Note Patient Name: Julian CaterJames Fernandez DOB: [birth year] MRN: [id number]   Date of Service  05/15/2018  HPI/Events of Note  Patient has a large air leak from L chest tube. Patient has been seen by Thoracic Surgery and is not felt to be a candidate for VATS to treat the air leak d/t end stage COPD. He appears well sedated on video assessment and is breath stacking on every breath.  Bedside nurse reports "foaminess" in Pleuevac chamber. Larger air leak?  eICU Interventions  Will order: 1. Vecuronium 8 mg IV  X 1 now. If his pulmonary mechanics improve. Will order continuous NMB with Nimbex.      Intervention Category Major Interventions: Respiratory failure - evaluation and management  [name] 05/15/2018, 12:15 AM

## 2018-05-15 NOTE — Progress Notes (Signed)
The CCM ground team came and assessed the chest tube for an increase in bubbling in the water seal chamber. After removing the old dressing, CCM MD found a small leak at the site of where the chest tube is sutured in. CCM MD changed the dressing, and the excessive air bubbles in the water seal chamber became significantly less and no more "foaminess" in the pleuevac chamber. Will continue to monitor throughout shift.

## 2018-05-15 NOTE — Plan of Care (Signed)
  Interdisciplinary Goals of Care Family Meeting   Date carried out:: 05/15/2018  Location of the meeting: Bedside  Member's involved: Physician, Nurse Practitioner, Bedside Registered Nurse and Other: Sonny MastersWarden Mecum from [facility]  Durable Power of Attorney or acting medical decision maker: I discussed the situation with the warden Mr. Dorina HoyerMecum and let him know that Mr. Beverely PaceBryant has a disease that man cannot cure and I think the best approach is to withdraw care.  He said that we are able to engage the family regarding this and proceed with goals of care conversation.    Discussion: We discussed goals of care for Chalmers CaterJames Clopper .  See above  Code status: Full DNR  Disposition: Continue current acute care  Time spent for the meeting: 30 min  Max FickleDouglas McQuaid 05/15/2018, 2:32 PM

## 2018-05-15 NOTE — Progress Notes (Signed)
      [address]       [place] [id number]             [phone]       Procedure: Chest tube suture placement  Pre-procedure exam: Patient is on the ventilator. Dressing taken down and bubbles at the skin. The dressing was saturated with straw-colored fluid. Clearly air moving in and out of small hole beside the chest tube. One purse string suture in place and one interrupted.   The chest tube dressing was taken down and site was cleaned with a chloraprep stick. Sterile gloves were donned and an interrupted suture was placed closing the small hole adjacent to the chest tube. There was little to no air moving in and out of the hole after closure. Two vasoline gauze were then wrapped around the chest tube and then sterile 4 x 4 gauze was applied with tape.  Complication: No complications. The patient tolerated the procedure well  Post-op exam: The patient was much more calm. Air leak appears to be improved at least at the skin level.   Jari Favreessa Monna Crean, PA-C

## 2018-05-15 NOTE — Progress Notes (Signed)
OVERNIGHT COVERAGE CRITICAL CARE PROGRESS NOTE  CTSP: re: loud, rumbling, continuous air leak in chest tuber circuit. Also, patient reportedly has been "stacking breaths" on vent, although this is not appreciated at the time of evaluation. Chest tube dressing is noted to have both Tegaderm and Vaseline gauze applied and reveals a loud audible air leak around the chest tube. Manipulation of the chest tube and dressing reveals that outside air is actually communicating from outside the dressing into the chest tube circuit.  Dressing was removed. Incision site and chest tube were cleaned with chlorhexidine. New Vaseline dressing with drain sponges and gauze were applied. No Tegaderm. External airleak appears to have abated. Audible air leak through dressing is no longer appreciated.  Marcelle SmilingSeong-Joo Gavan Nordby, MD Board Certified by the ABIM, Pulmonary Diseases & Critical Care Medicine

## 2018-05-15 NOTE — Progress Notes (Signed)
      [address]       [place] [address]             [phone]      Asked to see patient re: air leak around tube  Remains intubated, sedated  BP (!) 141/74   Pulse (!) 104   Temp 98.6 F (37 C)   Resp (!) 28   Ht 5\' 8"  (1.727 m)   Wt 81.2 kg   SpO2 95%   BMI 27.22 kg/m   Intake/Output Summary (Last 24 hours) at 05/15/2018 0843 Last data filed at 05/15/2018 0800 Gross per 24 hour  Intake 3363.73 ml  Output 2375 ml  Net 988.73 ml   Ct dressing taken down. Has a relatively large incision with air moving in and out Keep occlusive dressing in place  [place] C. Dorris Fetch, MD [facility] [phone]

## 2018-05-16 ENCOUNTER — Inpatient Hospital Stay (HOSPITAL_COMMUNITY): Payer: Medicare Other

## 2018-05-16 LAB — COMPREHENSIVE METABOLIC PANEL
ALT: 37 U/L (ref 0–44)
AST: 51 U/L — ABNORMAL HIGH (ref 15–41)
Albumin: 2 g/dL — ABNORMAL LOW (ref 3.5–5.0)
Alkaline Phosphatase: 30 U/L — ABNORMAL LOW (ref 38–126)
Anion gap: 4 — ABNORMAL LOW (ref 5–15)
BUN: 45 mg/dL — ABNORMAL HIGH (ref 8–23)
CHLORIDE: 111 mmol/L (ref 98–111)
CO2: 27 mmol/L (ref 22–32)
Calcium: 7.5 mg/dL — ABNORMAL LOW (ref 8.9–10.3)
Creatinine, Ser: 1.06 mg/dL (ref 0.61–1.24)
Glucose, Bld: 124 mg/dL — ABNORMAL HIGH (ref 70–99)
POTASSIUM: 4.8 mmol/L (ref 3.5–5.1)
SODIUM: 142 mmol/L (ref 135–145)
Total Bilirubin: 0.6 mg/dL (ref 0.3–1.2)
Total Protein: 3.9 g/dL — ABNORMAL LOW (ref 6.5–8.1)

## 2018-05-16 LAB — GLUCOSE, CAPILLARY
GLUCOSE-CAPILLARY: 109 mg/dL — AB (ref 70–99)
GLUCOSE-CAPILLARY: 126 mg/dL — AB (ref 70–99)
Glucose-Capillary: 126 mg/dL — ABNORMAL HIGH (ref 70–99)
Glucose-Capillary: 134 mg/dL — ABNORMAL HIGH (ref 70–99)
Glucose-Capillary: 172 mg/dL — ABNORMAL HIGH (ref 70–99)
Glucose-Capillary: 161 mg/dL — ABNORMAL HIGH (ref 70–99)

## 2018-05-16 MED ORDER — CLONAZEPAM 1 MG PO TABS
1.0000 mg | ORAL_TABLET | Freq: Two times a day (BID) | ORAL | Status: DC
  Administered 2018-05-16 – 2018-05-18 (×5): 1 mg
  Filled 2018-05-16 (×5): qty 1

## 2018-05-16 MED ORDER — MIDAZOLAM HCL 2 MG/2ML IJ SOLN
1.0000 mg | INTRAMUSCULAR | Status: DC | PRN
  Administered 2018-05-16 – 2018-05-18 (×12): 1 mg via INTRAVENOUS
  Filled 2018-05-16 (×12): qty 2

## 2018-05-16 MED ORDER — FUROSEMIDE 10 MG/ML IJ SOLN
40.0000 mg | Freq: Once | INTRAMUSCULAR | Status: AC
  Administered 2018-05-16: 40 mg via INTRAVENOUS
  Filled 2018-05-16: qty 4

## 2018-05-16 MED ORDER — QUETIAPINE FUMARATE 50 MG PO TABS
50.0000 mg | ORAL_TABLET | Freq: Two times a day (BID) | ORAL | Status: DC
  Administered 2018-05-16 – 2018-05-18 (×5): 50 mg
  Filled 2018-05-16 (×5): qty 1

## 2018-05-16 NOTE — Progress Notes (Signed)
RT NOTE: RR changed to 16 and I time lowered to .60 per MD orders. Vitals are stable. RT will continue to monitor.

## 2018-05-16 NOTE — Plan of Care (Signed)
  Problem: Clinical Measurements: Goal: Ability to maintain clinical measurements within normal limits will improve Outcome: Not Progressing Goal: Respiratory complications will improve Outcome: Not Progressing  Pt continues to have a left pneumothorax with an air leak. CT dressing had to be changed multiple times throughout the shift. There is still an audible air leak coming from CT insertion site.

## 2018-05-16 NOTE — Progress Notes (Signed)
Nutrition Follow-up  INTERVENTION:   Continue current tube feeding per discussion with MD: - Vital AF 1.2 @ 65 ml/hr (1560 ml/day)  Tube feeding regimen provides 1872 kcal, 117 grams of protein, and 1264 ml of H2O.  Clinical nutrition to sign off at this time given plan for terminal extubation on Friday, May 21, 2018. Please re-consult as needed.  NUTRITION DIAGNOSIS:   Inadequate oral intake related to acute illness as evidenced by NPO status.  Ongoing  GOAL:   Patient will meet greater than or equal to 90% of their needs  Met via TF  MONITOR:   TF tolerance, Vent status, Labs, Weight trends  REASON FOR ASSESSMENT:   Consult, Ventilator Enteral/tube feeding initiation and management  ASSESSMENT:   72 yo male presented [facility] from [place] with SOB and AMS; pt with acute on chronic respiratory failure with left tension PTX, AE COPD . Pt also underwent 12 min cardiac arrest requiring intubation and chest tube placement. PMH includes chronic respiratory failure with COPD on 4L O2 at baseline.   Pt continues to have large air leak from left chest tube. Cardiothoracic surgery consult and do not feel pt is a candidate for VATS to treat air link d/t end-stage COPD. Efforts were made on 11/12 to close external air leak around tube. However, air leak continues.  Per critical care MD note, pt with poor prognosis. Per RN, [place] discussed today with pt's family. Plan is for terminal extubation on Friday. Spoke with MD regarding tube feeding. Plan is to continue tube feeding at this time.  Weight up 29 lbs since admission. Suspect this is related to positive fluid balance of 16.7 L. Will continue to use admission weight of 68.3 kg as EDW when calculating nutrition needs.  Patient is currently intubated on ventilator support MV: 7.1 L/min Temp (24hrs), Avg:99.3 F (37.4 C), Min:98.4 F (36.9 C), Max:100 F (37.8 C) BP: 93/48 MAP: 61  Current tube feeding: Vital AF 1.2 @ 65  ml/hr  Precedex: 20.8 ml/hr Fentanyl: 20 ml/hr  Medications reviewed and include: SSI, IV Pepcid  Labs reviewed: BUN 45 (H) CBG's: 109, 126, 142, 146, 160, 118 x 24 hours  UOP: 1475 ml x 24 hours Chest tube output: 800 ml x 24 hours I/O's: +16.7 L since admit  Diet Order:   Diet Order            Diet NPO time specified  Diet effective now              EDUCATION NEEDS:   Not appropriate for education at this time  Skin:  Skin Assessment: Reviewed RN Assessment  Last BM:  11/12 (smear type 7)  Height:   Ht Readings from Last 1 Encounters:  05/20/2018 5' 8"  (1.727 m)    Weight:   Wt Readings from Last 1 Encounters:  05/15/18 81.2 kg    Ideal Body Weight:  70 kg  BMI:  Body mass index is 27.22 kg/m.  Estimated Nutritional Needs:   Kcal:  5732  Protein:  100-135 g  Fluid:  >/= 1.8 L    Gaynell Face, MS, RD, LDN Inpatient Clinical Dietitian Pager: [phone] Weekend/After Hours: [phone]

## 2018-05-16 NOTE — Progress Notes (Signed)
NAME:  Julian Fernandez, MRN:  [id number], DOB:  [birth year], LOS: 7 ADMISSION DATE:  02-Jun-2018, CONSULTATION DATE:  06-02-18 REFERRING MD:  [facility], CHIEF COMPLAINT:  Cardiac arrest   Brief History   72 yo M with PMH significant for chronic hypoxic respiratory failure on 4L O2 and COPD presented to [facility] from [place] with SOB and AMS.  Found to have left tension pneumothorax.  Had 12 min cardiac arrest with intubation and CT placement.  Pt with progressive shock and new mass pneumoperitoneum.  Recent hospitalization 2 weeks ago for AECOPD and HCAP.   Past Medical History  COPD, chronic hypoxic respiratory failure   Significant Hospital Events   11/6 tx to [facility] from [place]/ cardiac arrest  11/12: Still having marked bronchopleural fistula.  There was some question as to whether or not some of the turbulent air was due to size of chest tube stoma this was sutured, but still continues to have 7 out of 7 airleak.  We discussed further tube repositioning and management, decided against this given severity of underlying lung injury.  May DO NOT RESUSCITATE. Consults: date of consult/date signed off & final recs:  CVTS 11/9>   Procedures (surgical and bedside):  11/6 ETT >> 11/6 Left 30 f CT >> 11/6 L IJ TL >> 11/6 foley >>  Significant Diagnostic Tests:  11/6 CT Chest >> Left CT in good position with residual 25% pnx.  Massive pneumoperitoneum, most likely dissected own from the pneumothorax.  There is also extensive aire in the abdominal wall muscles.  No free fluid or obvious bowel injury.  Severe emphysema.  Bilateral pleural effusions.  CT Head 11/6 >> 3 mm thickness posterior left convexity subdural hematoma. No midline shift or other mass effect.  Echo 11/7>>Completely unable to visualize heart, even with attempt at using   echo contrast (definity). This may be due to air interface, such   as with pneumothorax or severe emphysema. Unable to comment on   any cardiac structures.  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 rate (Abnormal) 34, height 5\' 8"  (1.727 m), weight 81.2 kg, SpO2 96 %.    Vent Mode: PRVC FiO2 (%):  [40 %] 40 % Set Rate:  [34 bmp] 34 bmp Vt Set:  [410 mL] 410 mL PEEP:  [5 cmH20] 5 cmH20 Plateau Pressure:  [15 cmH20-25 cmH20] 16 cmH20   Intake/Output Summary (Last 24 hours) at 05/16/2018 0945 Last data filed at 05/16/2018 4010 Gross per 24 hour  Intake 3268.1 ml  Output 2125 ml  Net 1143.1 ml   Filed Weights   05/13/18 0500 05/14/18 0400 05/15/18 0453  Weight: 77.7 kg 80.3 kg 81.2 kg    Examination: General: Chronically ill 72 year old male currently on full ventilator support he has marked accessory use, intolerant of pressure support attempt HEENT: Orally intubated no jugular venous distention Pulmonary: Coarse rhonchi, prolonged expiratory wheeze, 7 out of 7 airleak appreciated on left chest tube with turbulent  airflow Cardiac: Regular rate and rhythm Abdomen: Soft, not tender GU: Clear yellow Neuro: Awake, encephalopathic, will intermittently follow simple commands in a one-step fashion. Extremities: Brisk capillary refill warm and dry   Resolved Hospital Problem list   AKI  Assessment & Plan:  Acute on chronic hypoxic respiratory failure/failure to wean- multifactorial related to Left tension pneumothorax, AECOPD, +/- HCAP, and cardiac arrest. He still has marked air trapping  and wide open Broncho-pleural fistula  Portable chest x-ray personally reviewed for today 11/13:His endotracheal tubes in satisfactory position, left chest tube also in satisfactory position.  No obvious pneumothorax on today's film, does have marked subcutaneous air.  Airspace disease looks unchanged -He has severe underlying lung disease, doubt we will be able to successfully wean him Plan Continuing full ventilator support No change in chest tube management, continuing 20 cm suction No change in bronchodilators or systemic steroids PAD protocol RASS goal -2 Furosemide x1 Adjusting anxioytic and delirium regimen Anticipate one-way extubation towards the end of the week, hoping adding clonazepam and Seroquel may help with work of breathing   Cardiac Arrest presumed due to left tension pneumothorax Cardiogenic shock from air trapping: resolved Plan Continue telemetry monitoring DO NOT RESUSCITATE should he arrest No escalation of care  Acute encephalopathy after cardiac arrest and ? New unequal pupils  Small Sub Dural on CT head (stable on repeat imaging) ?  Seizure-like activity, concern for anoxic injury. Plan: Supportive care PAD protocol RASS goal -2  Intermittent fluid and electrolyte imbalance Plan Limit lab draws  Hyperglycemia  Plan No change in sliding scale insulin  Protein calorie malnutrition Plan  Continue tube feeds  Demand ischemia: Plan: Continue supportive care     Diet: Initiate TF Pain/Anxiety/Delirium protocol (if indicated) fentanyl/precedex per PAD protocol VAP protocol (if indicated): yes DVT prophylaxis: SCDs small subdural hematoma on CT scan GI prophylaxis: pepcid Hyperglycemia protocol: SSI  Mobility: bed rest  Code Status: Full, made DNR 11/12 Family Communication: no family at bedside.  Officer remains at bedside.   Simonne MartinetPeter E Jakyla Reza ACNP-BC [facility] Pul[facility] Care Pager # [phone] OR # [phone] if no answer   05/16/2018,  9:45 AM

## 2018-05-17 ENCOUNTER — Inpatient Hospital Stay (HOSPITAL_COMMUNITY): Payer: Medicare Other

## 2018-05-17 LAB — BASIC METABOLIC PANEL
Anion gap: 6 (ref 5–15)
BUN: 45 mg/dL — AB (ref 8–23)
CHLORIDE: 108 mmol/L (ref 98–111)
CO2: 29 mmol/L (ref 22–32)
CREATININE: 0.91 mg/dL (ref 0.61–1.24)
Calcium: 7.8 mg/dL — ABNORMAL LOW (ref 8.9–10.3)
GFR calc Af Amer: >60 mL/min (ref >60)
GFR calc non Af Amer: >60 mL/min (ref >60)
Glucose, Bld: 128 mg/dL — ABNORMAL HIGH (ref 70–99)
Potassium: 5.1 mmol/L (ref 3.5–5.1)
SODIUM: 143 mmol/L (ref 135–145)

## 2018-05-17 LAB — GLUCOSE, CAPILLARY
GLUCOSE-CAPILLARY: 143 mg/dL — AB (ref 70–99)
GLUCOSE-CAPILLARY: 164 mg/dL — AB (ref 70–99)
Glucose-Capillary: 113 mg/dL — ABNORMAL HIGH (ref 70–99)
Glucose-Capillary: 138 mg/dL — ABNORMAL HIGH (ref 70–99)
Glucose-Capillary: 143 mg/dL — ABNORMAL HIGH (ref 70–99)
Glucose-Capillary: 152 mg/dL — ABNORMAL HIGH (ref 70–99)

## 2018-05-17 MED ORDER — PREDNISONE 5 MG/5ML PO SOLN
20.0000 mg | Freq: Every day | ORAL | Status: DC
  Administered 2018-05-18: 20 mg
  Filled 2018-05-17: qty 20

## 2018-05-17 MED ORDER — FAMOTIDINE 40 MG/5ML PO SUSR
20.0000 mg | Freq: Every day | ORAL | Status: DC
  Administered 2018-05-17: 20 mg
  Filled 2018-05-17: qty 2.5

## 2018-05-17 NOTE — Progress Notes (Signed)
NAME:  Julian Fernandez, MRN:  [id number], DOB:  1946-04-27, LOS: 8 ADMISSION DATE:  05/12/2018, CONSULTATION DATE:  05/29/2018 REFERRING MD:  [facility], CHIEF COMPLAINT:  Cardiac arrest   Brief History   72 yo M with PMH significant for chronic hypoxic respiratory failure on 4L O2 and COPD presented to [facility] from [place] with SOB and AMS.  Found to have left tension pneumothorax.  Had 12 min cardiac arrest with intubation and chest tube placement.  Pt with progressive shock and new massive pneumoperitoneum.  Recent hospitalization 2 weeks ago for AECOPD and HCAP.   Past Medical History  COPD, chronic hypoxic respiratory failure   Significant Hospital Events   11/06 tx to [facility] from [place]/ cardiac arrest  11/12 Still having marked bronchopleural fistula.  There was some question as to whether or not some of the turbulent air was due to size of chest tube stoma this was sutured, but still continues to have 7 out of 7 airleak.  We discussed further tube repositioning and management, decided against this given severity of underlying lung injury.  Made DO NOT RESUSCITATE.  Consults: date of consult/date signed off & final recs:  CVTS 11/9 >   Procedures (surgical and bedside):  11/6 ETT >> 11/6 Left 30 f CT >> 11/6 L IJ TL >>  Significant Diagnostic Tests:  11/6 CT Chest >> Left CT in good position with residual 25% pnx.  Massive pneumoperitoneum, most likely dissected own from the pneumothorax.  There is also extensive aire in the abdominal wall muscles.  No free fluid or obvious bowel injury.  Severe emphysema.  Bilateral pleural effusions.  CT Head 11/6 >> 3 mm thickness posterior left convexity subdural hematoma. No midline shift or other mass effect.  Echo 11/7>>Completely unable to visualize heart, even with attempt at using   echo contrast (definity). This may be due to air interface, such   as with pneumothorax or severe emphysema. Unable to comment on   any cardiac structures.  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  Didn't tolerate pressure support.  Remains on sedation.  Objective   Blood pressure (!) 85/47, pulse 81, temperature 99 F (37.2 C), resp. rate 17, height 5' 8"  (1.727 m), weight 81.2 kg, SpO2 100 %.    Vent Mode: PRVC FiO2 (%):  [40 %] 40 % Set Rate:  [16 bmp] 16 bmp Vt Set:  [410 mL] 410 mL PEEP:  [5 cmH20] 5 cmH20 Plateau Pressure:  [11 cmH20-23 cmH20] 23 cmH20   Intake/Output Summary (Last 24 hours) at 05/17/2018 1149 Last data filed at 05/17/2018 1000 Gross per 24 hour  Intake 2520.87 ml  Output 2390 ml  Net 130.87 ml   Filed Weights   05/13/18 0500 05/14/18 0400 05/15/18 0453  Weight: 77.7 kg 80.3 kg 81.2 kg    Examination:  General - sedated Eyes - pupils pinpoint and reactive ENT - ETT in place Cardiac - regular rate/rhythm, no murmur Chest - decreased BS, scattered rhonchi, Lt chest tube in place Abdomen - soft, non tender, +  bowel sounds Extremities - 1+ edema Neuro - RASS -2    Resolved Hospital Problem list   AKI  Assessment & Plan:   Acute on chronic hypoxic respiratory failure from Lt tension PTX with bronchopleural fistula. COPD exacerbation with severe emphysema. - full vent support for now - continue chest tube to suction for now - scheduled BDs - change to prednisone  Cardiac arrest from pneumothorax. Demand ischemia. - monitor hemodynamics  Acute metabolic encephalopathy. Small SDH. - RASS goal -1 to -2  Goals of care. - met with family 11/14 - DNR -  plan for one way extubation 11/15      Diet: Initiate TF DVT prophylaxis: SCDs GI prophylaxis: pepcid Mobility: bed rest   CC time 32 minutes  Chesley Mires, MD [place] 05/17/2018, 12:21 PM

## 2018-05-18 ENCOUNTER — Encounter (HOSPITAL_COMMUNITY): Payer: Self-pay

## 2018-05-18 ENCOUNTER — Other Ambulatory Visit: Payer: Self-pay

## 2018-05-18 DIAGNOSIS — Z9689 Presence of other specified functional implants: Secondary | ICD-10-CM

## 2018-05-18 DIAGNOSIS — J9382 Other air leak: Secondary | ICD-10-CM

## 2018-05-18 DIAGNOSIS — J9600 Acute respiratory failure, unspecified whether with hypoxia or hypercapnia: Secondary | ICD-10-CM

## 2018-05-18 DIAGNOSIS — J9690 Respiratory failure, unspecified, unspecified whether with hypoxia or hypercapnia: Secondary | ICD-10-CM

## 2018-05-18 LAB — GLUCOSE, CAPILLARY
GLUCOSE-CAPILLARY: 133 mg/dL — AB (ref 70–99)
Glucose-Capillary: 154 mg/dL — ABNORMAL HIGH (ref 70–99)
Glucose-Capillary: 143 mg/dL — ABNORMAL HIGH (ref 70–99)

## 2018-05-18 MED ORDER — LORAZEPAM 2 MG/ML IJ SOLN
2.0000 mg | INTRAMUSCULAR | Status: DC | PRN
  Administered 2018-05-18: 4 mg via INTRAVENOUS
  Filled 2018-05-18: qty 2

## 2018-05-18 MED ORDER — GLYCOPYRROLATE 0.2 MG/ML IJ SOLN
0.2000 mg | INTRAMUSCULAR | Status: DC | PRN
  Administered 2018-05-18 (×2): 0.2 mg via INTRAVENOUS
  Filled 2018-05-18 (×2): qty 1

## 2018-05-18 MED ORDER — DEXTROSE 5 % IV SOLN: INTRAVENOUS | Status: DC

## 2018-05-18 MED ORDER — LORAZEPAM 2 MG/ML IJ SOLN
0.5000 mg/h | INTRAVENOUS | Status: DC
  Administered 2018-05-18: 4 mg/h via INTRAVENOUS
  Filled 2018-05-18 (×2): qty 25

## 2018-05-18 MED ORDER — GLYCOPYRROLATE 1 MG PO TABS: 1.0000 mg | ORAL_TABLET | ORAL | Status: DC | PRN

## 2018-05-18 MED ORDER — HALOPERIDOL LACTATE 5 MG/ML IJ SOLN
2.5000 mg | INTRAMUSCULAR | Status: DC | PRN
  Administered 2018-05-18: 5 mg via INTRAVENOUS
  Filled 2018-05-18: qty 1

## 2018-05-18 MED ORDER — POLYVINYL ALCOHOL 1.4 % OP SOLN
1.0000 [drp] | Freq: Four times a day (QID) | OPHTHALMIC | Status: DC | PRN
  Filled 2018-05-18: qty 15

## 2018-05-18 MED ORDER — MORPHINE SULFATE (PF) 2 MG/ML IV SOLN
2.0000 mg | INTRAVENOUS | Status: DC | PRN
  Administered 2018-05-18: 2 mg via INTRAVENOUS

## 2018-05-18 MED ORDER — DIPHENHYDRAMINE HCL 50 MG/ML IJ SOLN: 25.0000 mg | INTRAMUSCULAR | Status: DC | PRN

## 2018-05-18 MED ORDER — MORPHINE 100MG IN NS 100ML (1MG/ML) PREMIX INFUSION
0.0000 mg/h | INTRAVENOUS | Status: DC
  Administered 2018-05-18: 5 mg/h via INTRAVENOUS
  Administered 2018-05-18 (×2): 20 mg/h via INTRAVENOUS
  Filled 2018-05-18 (×4): qty 100

## 2018-05-18 MED ORDER — GLYCOPYRROLATE 0.2 MG/ML IJ SOLN: 0.2000 mg | INTRAMUSCULAR | Status: DC | PRN

## 2018-05-18 MED ORDER — KETOROLAC TROMETHAMINE 30 MG/ML IJ SOLN
30.0000 mg | Freq: Four times a day (QID) | INTRAMUSCULAR | Status: DC
  Administered 2018-05-18 (×2): 30 mg via INTRAVENOUS
  Filled 2018-05-18 (×2): qty 1

## 2018-05-18 MED ORDER — MORPHINE BOLUS VIA INFUSION
5.0000 mg | INTRAVENOUS | Status: DC | PRN
  Administered 2018-05-18 (×9): 5 mg via INTRAVENOUS
  Filled 2018-05-18: qty 5

## 2018-05-19 NOTE — Progress Notes (Signed)
100 of morphine wasted in waste container with Annabell Sabalourtney Wofford, RN.   Noe GensStefanie A Finch Costanzo, RN

## 2018-05-19 NOTE — Progress Notes (Signed)
ME at bedside to gather information and retrieve body.   Noe GensStefanie A Natali Lavallee, RN

## 2018-05-19 NOTE — Progress Notes (Signed)
Per ME, because patient was in custody at time of admission [place]. Plans to come to bedside to help us prepare patient to be transported downstairs and ensure nothing is tampered with. Furthermore, will [facility]. Awaiting ME arrival.   Noe GensStefanie A Maleni Seyer, RN

## 2018-05-22 NOTE — Discharge Summary (Signed)
Julian Fernandez was a 72 y.o. male admitted on 05/17/2018 with shortness of breath and altered mental status.  He was found to have a left tension pneumothorax.  He developed cardiac arrest with ROSC after 12 minutes and chest tube insertion.  He had persistent bronchopleural fistula with continued air leak.  He was also treated for a COPD exacerbation and pneumonia.  He was assessed by cardiothoracic surgery.  Family opted for DNR status and comfort measures.  He was extubated on 05/06/2018 and expired at 2217.  He was incarcerated prior to this admission.  As such he was determined to be a medical examiner case.   Final diagnoses: Left tension pneumothorax with bronchopleural fistula Acute hypoxic respiratory failure COPD exacerbation Emphysema Cardiac arrest from pneumothorax Hospital acquired pneumonia Demand ischemia Subdural hematoma  Julian HellingVineet Savyon Loken, MD [facility] Pulmonary/Critical Care 05/22/2018, 4:36 PM

## 2018-06-03 NOTE — Progress Notes (Signed)
NAME:  Julian Fernandez, MRN:  [id number], DOB:  1945-10-24, LOS: 9 ADMISSION DATE:  05/17/2018, CONSULTATION DATE:  05/13/2018 REFERRING MD:  [facility], CHIEF COMPLAINT:  Cardiac arrest   Brief History   72 yo M with PMH significant for chronic hypoxic respiratory failure on 4L O2 and COPD presented to [facility] from [place] with SOB and AMS.  Found to have left tension pneumothorax.  Had 12 min cardiac arrest with intubation and chest tube placement.  Pt with progressive shock and new massive pneumoperitoneum.  Recent hospitalization 2 weeks ago for AECOPD and HCAP.   Past Medical History  COPD, chronic hypoxic respiratory failure   Significant Hospital Events   11/06 tx to [facility] from [place]/ cardiac arrest  11/12 Still having marked bronchopleural fistula.  There was some question as to whether or not some of the turbulent air was due to size of chest tube stoma this was sutured, but still continues to have 7 out of 7 airleak.  We discussed further tube repositioning and management, decided against this given severity of underlying lung injury.  Made DO NOT RESUSCITATE. 7/12 through 11/15: Anxiety lytics have been adjusted.  He continues to have large air leak with no improvement clinically.  Plan will be to proceed with one-way extubation after meeting with family  Consults: date of consult/date signed off & final recs:  CVTS 11/9 >   Procedures (surgical and bedside):  11/6 ETT >> 11/6 Left 30 f CT >> 11/6 L IJ TL >>  Significant Diagnostic Tests:  11/6 CT Chest >> Left CT in good position with residual 25% pnx.  Massive pneumoperitoneum, most likely dissected own from the pneumothorax.  There is also extensive aire in the abdominal wall muscles.  No free fluid or obvious bowel injury.  Severe emphysema.  Bilateral pleural effusions.  CT Head 11/6 >> 3 mm thickness posterior left convexity subdural hematoma. No midline shift or other mass effect.  Echo 11/7>>Completely unable to visualize  heart, even with attempt at using   echo contrast (definity). This may be due to air interface, such   as with pneumothorax or severe emphysema. Unable to comment on   any cardiac structures.  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 rate 15, height 5' 8"  (1.727 m), weight 75.8 kg, SpO2 95 %.    Vent Mode: PRVC FiO2 (%):  [40 %] 40 % Set Rate:  [16 bmp] 16 bmp Vt Set:  [410 mL] 410 mL PEEP:  [5 cmH20] 5 cmH20 Plateau Pressure:  [14 cmH20-21 cmH20] 21 cmH20   Intake/Output Summary (Last 24 hours) at Jun 10, 2018 1142 Last data filed at Jun 10, 2018 1000 Gross per 24 hour  Intake 2632.08 ml  Output 2346 ml  Net 286.08 ml   Filed Weights   05/14/18 0400 05/15/18 0453 Jun 10, 2018 0500  Weight: 80.3 kg 81.2 kg 75.8 kg    Examination:  General: Sedated, minimally responsive today. HEENT normocephalic atraumatic no jugular venous distention orally intubated Pulmonary: Prolonged expiratory wheeze, scattered rhonchi.  Chest exam shows ongoing subcutaneous air.  Left chest tube with continuous air leak Cardiac: Regular rate and rhythm Abdomen: Soft nontender Remedies: Warm, dry, strong pulses.  Dependent edema. Neuro: Sedated GU: Concentrated yellow   Resolved Hospital Problem list   AKI  Assessment & Plan:   Acute on chronic hypoxic respiratory failure from Lt tension  PTX with bronchopleural fistula. COPD exacerbation with severe emphysema. -Absolutely no improvement over the last 72 hours -I do not think he is weanable -Still has significant air leak from chest tube Plan Continuing full ventilator support VAP bundle No change in prednisone No change in bronchodilators Meet with family later today, plan will be to proceed with one-way extubation and comfort care  Cardiac arrest from pneumothorax. Demand ischemia. Plan Telemetry, no escalation of care  Acute metabolic encephalopathy. Small SDH. Plan RASS goal -2 for now, once extubated will focus solely on comfort  Goals of care. - met with family 11/14 Plan DO NOT RESUSCITATE Anticipate one-way extubation later today      Diet: Initiate TF DVT prophylaxis: SCDs GI prophylaxis: pepcid Mobility: bed rest   cct 34 min  Erick Colace ACNP-BC [place] Pager # [phone] OR # [phone] if no answer

## 2018-06-03 NOTE — Progress Notes (Signed)
Wasted 5 mL (10 mg) of lorazepam IV in the preparation of lorazepam drip. Shirlee LatchMichelle Espada was witness.

## 2018-06-03 NOTE — Progress Notes (Signed)
Patient extubated per order, placed on 4LNC, NP aware, no stridor heard.

## 2018-06-03 NOTE — Progress Notes (Signed)
Family meeting completed We discussed the patient's current exam, lack of progress, and more importantly his prior goals Family unanimously has agreed on withdrawal of care Plan Start morphine drip PRN Ativan Scheduled Toradol Extubate Full comfort  Simonne MartinetPeter E Ramia Sidney ACNP-BC [facility] Pager # [phone] OR # [phone] if no answer

## 2018-06-03 NOTE — Progress Notes (Signed)
Patient cardiac arrested after withdrawal of care. TOD 2217. Verified by 2 RN's. MD notified, CDS notified, ME notified.   Noe GensStefanie A Anvika Gashi, RN

## 2018-06-03 DEATH — deceased

## 2019-07-24 IMAGING — DX DG CHEST 1V PORT
1 series · 2 of 2 positions shown · non-contrast
Comparison: 05/13/2018 and 05/09/2018.

CLINICAL DATA: Tension pneumothorax, cardiac arrest.

EXAM:
PORTABLE CHEST 1 VIEW

[Series 1: chest ap · 0.14mm/px · 2 of 2 slices shown]
[im 1/2]
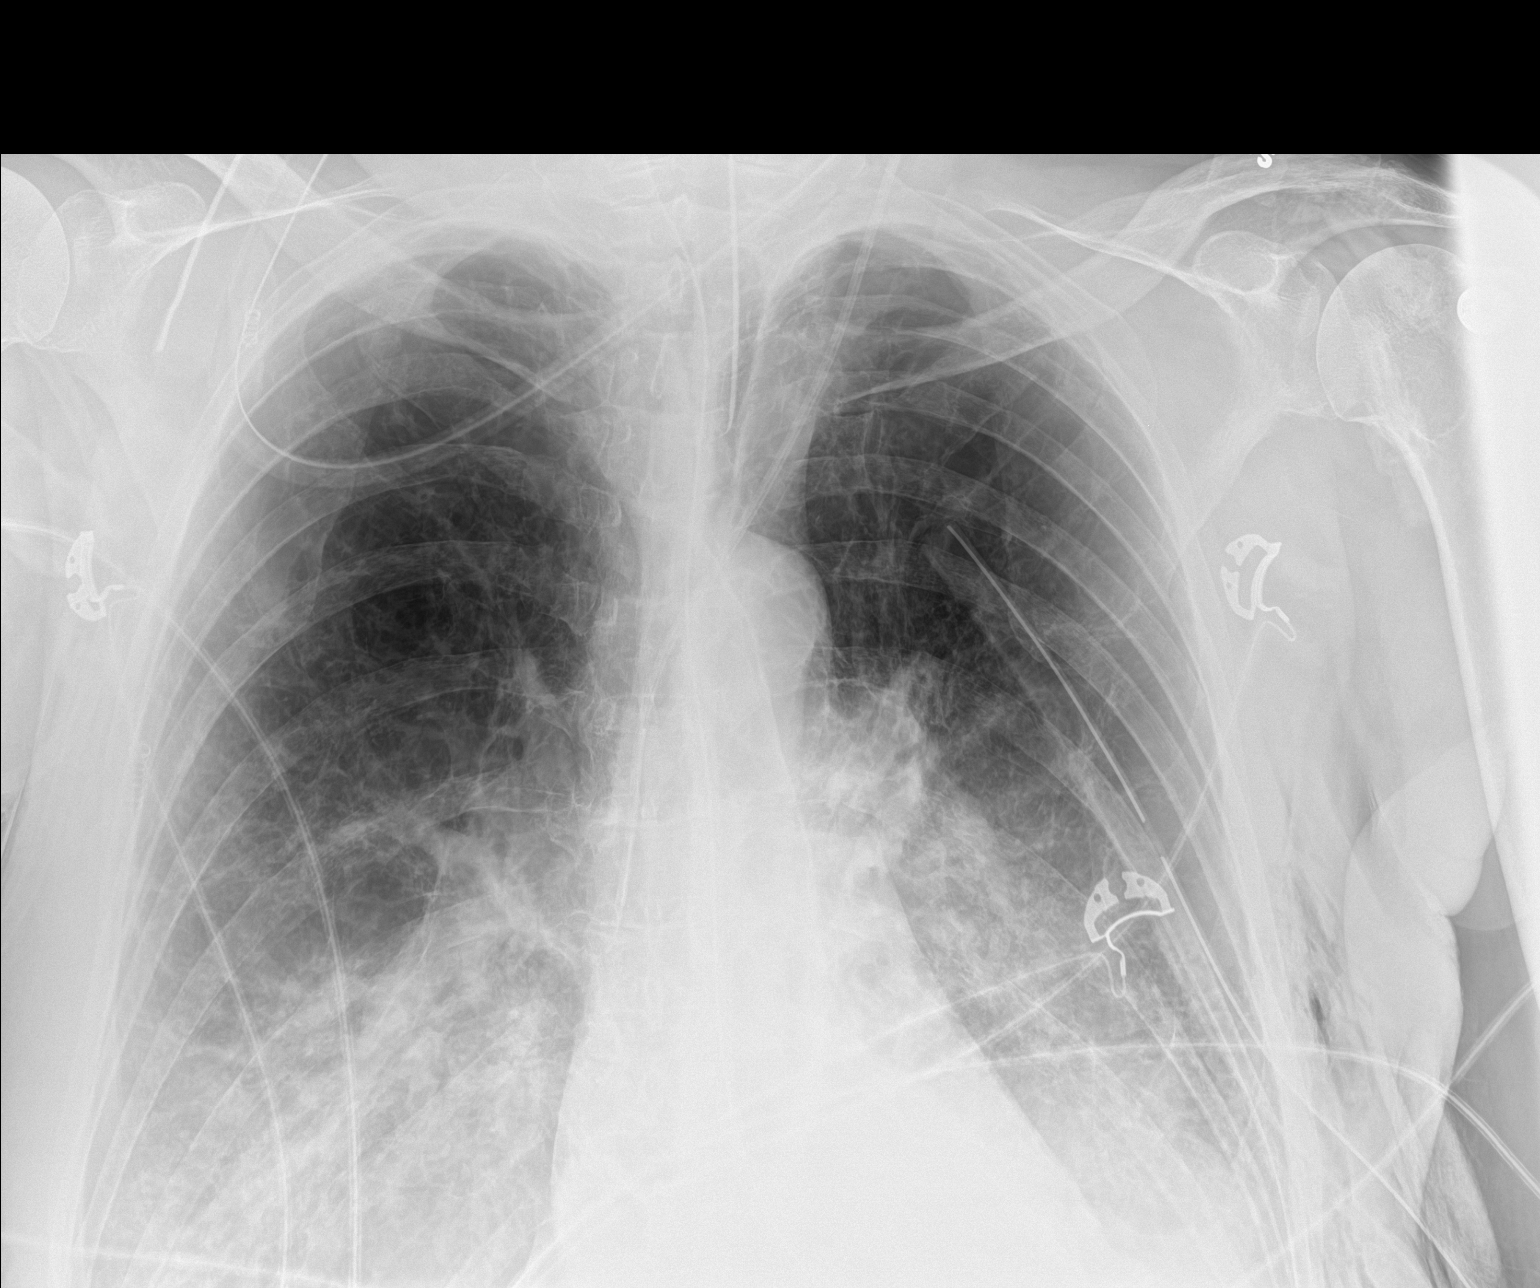
[im 2/2]
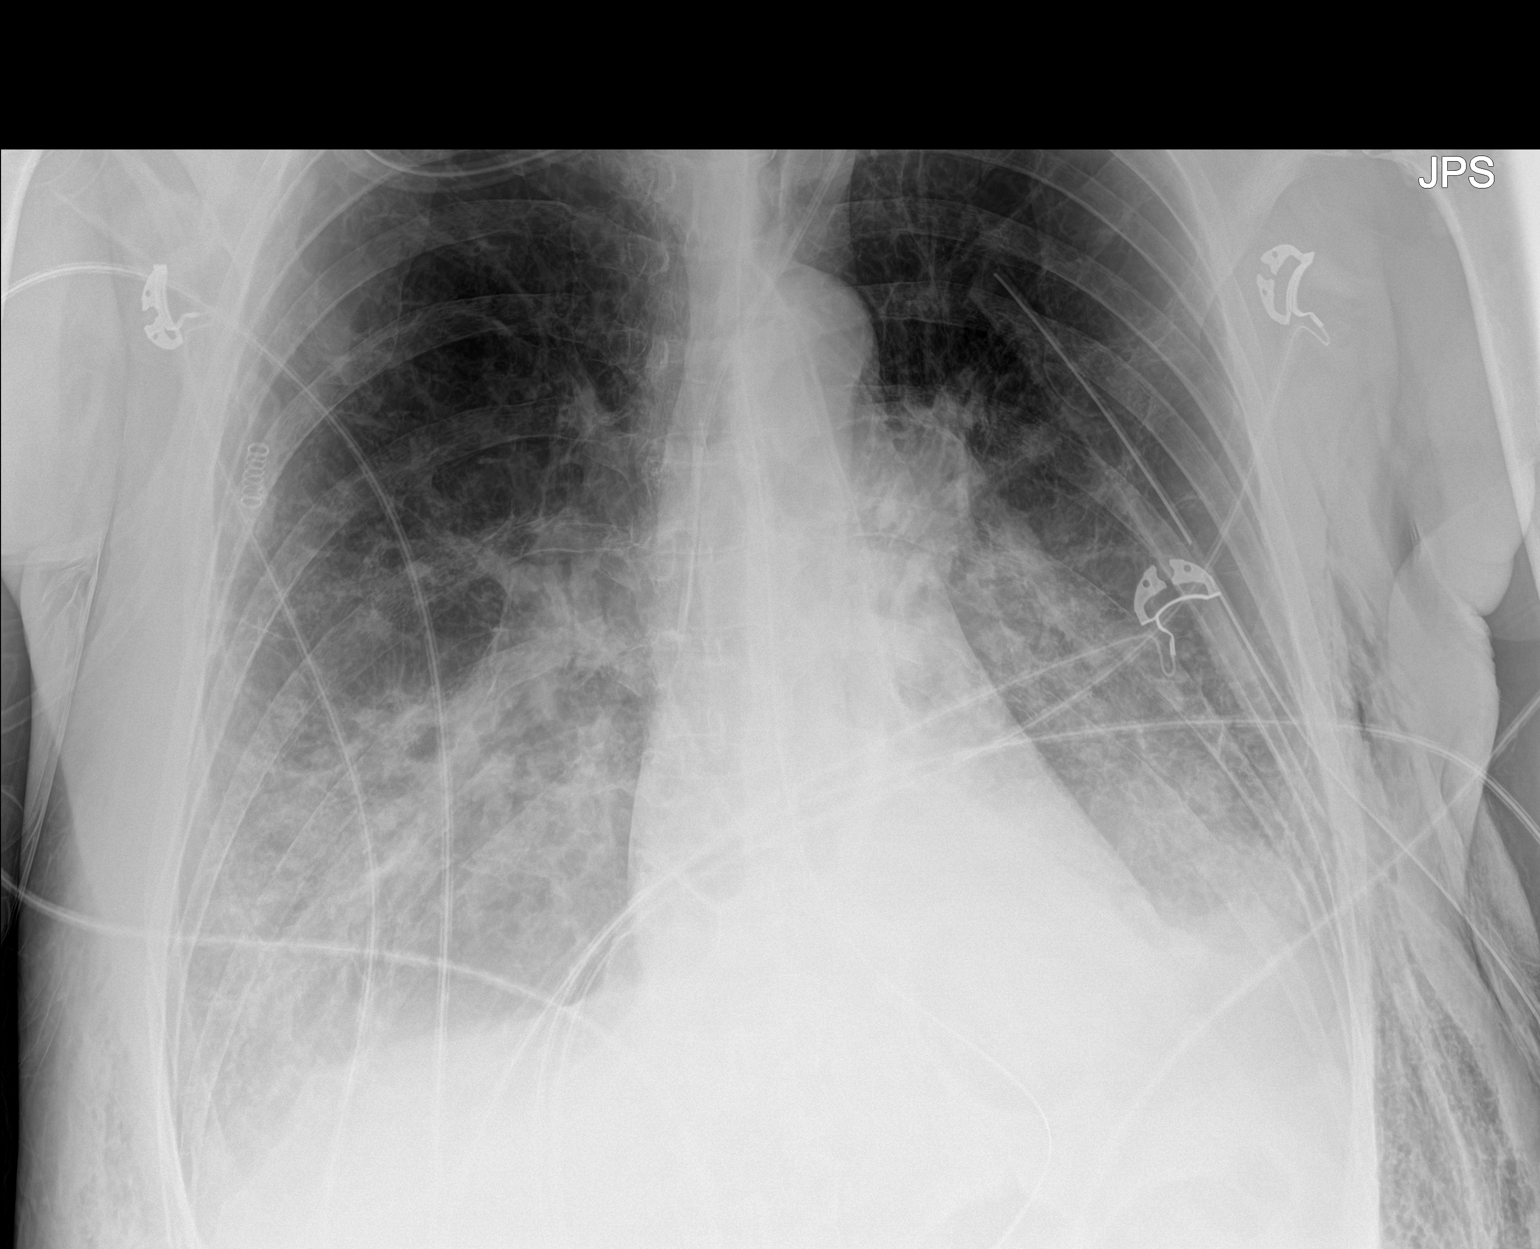

[2 of 2 positions shown; findings below may reference images not displayed]

FINDINGS: Endotracheal tube terminates approximately 8.8 cm above the carina.
Nasogastric tube is followed into the stomach with the tip
projecting beyond the inferior margin of the image. Left IJ central
line tip is in the SVC. Left chest tube remains in place.

Trachea is midline given slight patient rotation. Heart size within
normal limits. Severe emphysema with bibasilar airspace
opacification and developing left lower lobe airspace consolidation.
Small bilateral pleural effusions. Subcutaneous emphysema along both
lateral chest walls, left greater than right. No pneumothorax.
IMPRESSION: 1. No pneumothorax with left chest tube in place.
2. Bibasilar airspace opacification with developing left lower lobe
collapse consolidation, findings which may be due to pneumonia.
3. Small bilateral pleural effusions.
4.  Emphysema ([id number]).

## 2019-07-24 IMAGING — MR MR HEAD WO/W CM
12 of 14 series · 38 of 48 positions shown · IV contrast (gadavist)
Comparison: Comparison made with most recent CT from 05/09/2018.

CLINICAL DATA: Initial evaluation for acute altered mental status,
prior cardiac arrest, evaluate for anoxic brain injury.

EXAM:
MRI HEAD WITHOUT AND WITH CONTRAST
TECHNIQUE: Multiplanar, multiecho pulse sequences of the brain and surrounding
structures were obtained without and with intravenous contrast.
CONTRAST:  7 cc of Gadavist.

[Series 5: ax dwi_tracew · axial · 3.0mm · 1.50mm/px · z∈[-32,+107]mm · 6 of 96 slices shown]
[im 1/96]
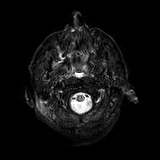
[im 20/96]
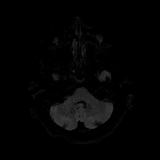
[im 39/96]
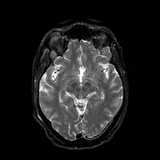
[im 58/96]
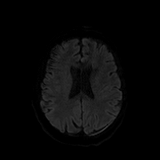
[im 77/96]
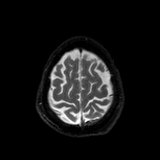
[im 96/96]
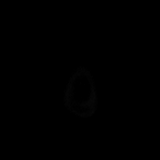

[Series 6: ax dwi_adc · axial · 3.0mm · 1.50mm/px · z∈[-32,+107]mm · 4 of 47 slices shown]
[im 1/47]
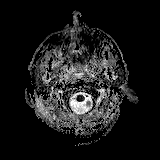
[im 16/47]
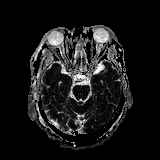
[im 31/47]
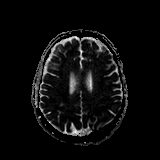
[im 47/47]
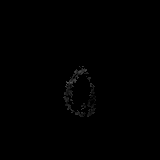

[Series 7: cor dwi_tracew · coronal · 5.0mm · 1.44mm/px · 5 of 64 slices shown]
[im 1/64]
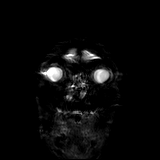
[im 16/64]
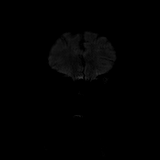
[im 32/64]
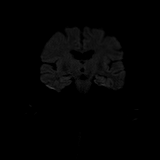
[im 48/64]
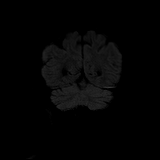
[im 64/64]
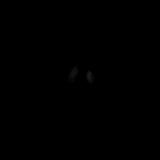

[Series 8: cor dwi_adc · coronal · 5.0mm · 1.44mm/px · 2 of 32 slices shown]
[im 1/32]
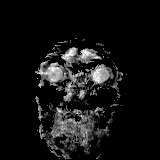
[im 32/32]
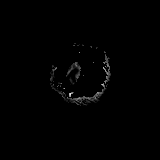

[Series 9: T1 · sagittal · 5.0mm · 0.75mm/px · 2 of 23 slices shown]
[im 1/23]
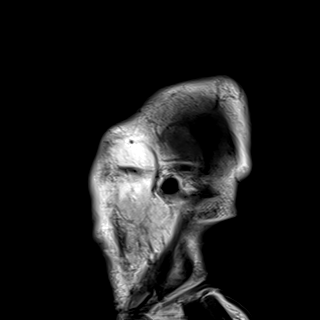
[im 23/23]
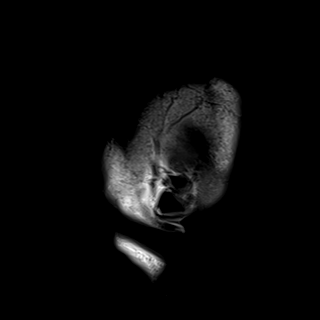

[Series 10: T2 · axial · 5.0mm · 0.72mm/px · z∈[-29,+113]mm · 2 of 25 slices shown]
[im 1/25]
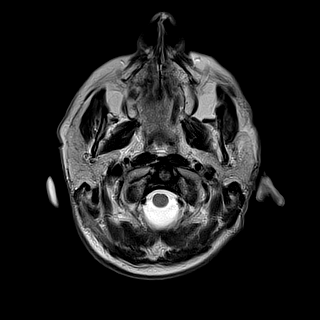
[im 25/25]
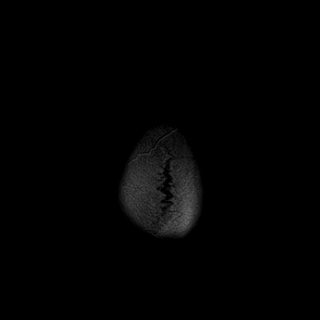

[Series 11: FLAIR · axial · 5.0mm · 0.45mm/px · z∈[-26,+116]mm · 2 of 25 slices shown]
[im 1/25]
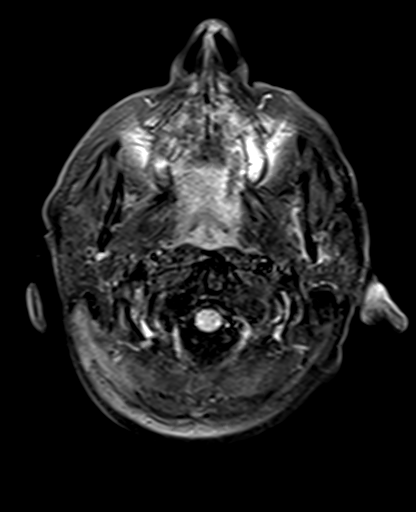
[im 25/25]
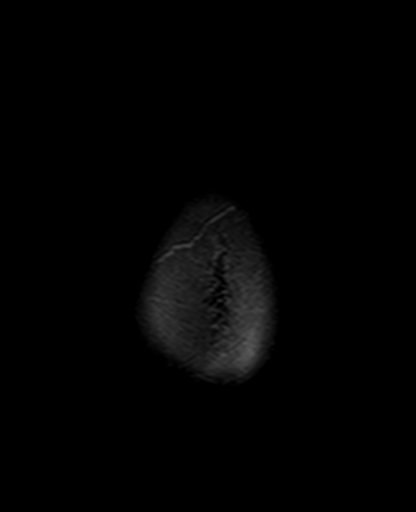

[Series 12: swi_images · axial · 3.0mm · 0.90mm/px · z∈[-53,+122]mm · 5 of 60 slices shown]
[im 1/60]
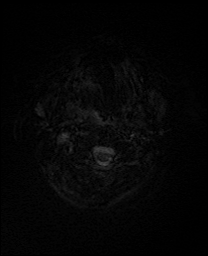
[im 15/60]
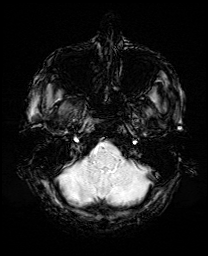
[im 30/60]
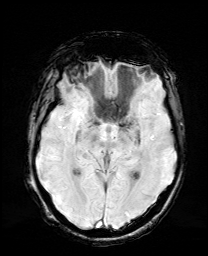
[im 45/60]
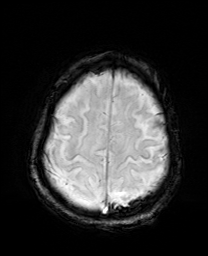
[im 60/60]
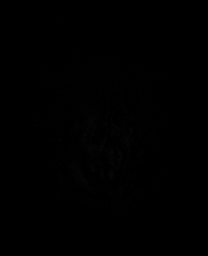

[Series 13: mip_images(sw) · axial · 24.0mm · 0.90mm/px · z∈[-42,+112]mm · 4 of 53 slices shown]
[im 1/53]
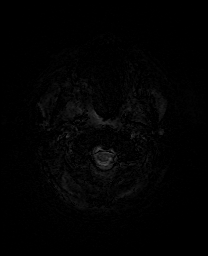
[im 18/53]
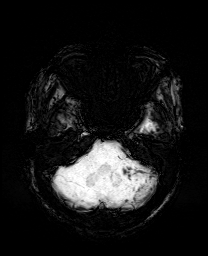
[im 35/53]
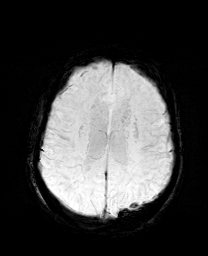
[im 53/53]
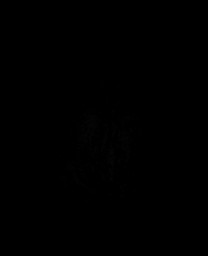

[Series 15: T2 post-contrast · coronal · 5.0mm · 0.72mm/px · 2 of 28 slices shown]
[im 1/28]
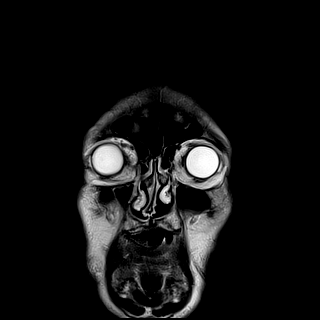
[im 28/28]
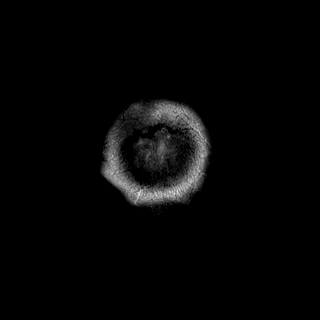

[Series 17: T1 post-contrast · coronal · 5.0mm · 0.34mm/px · 2 of 28 slices shown (1 of 2)]
[im 1/28]
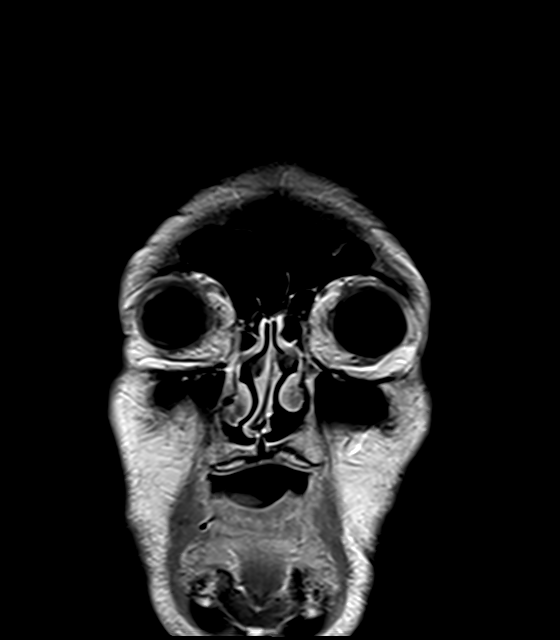
[im 28/28]
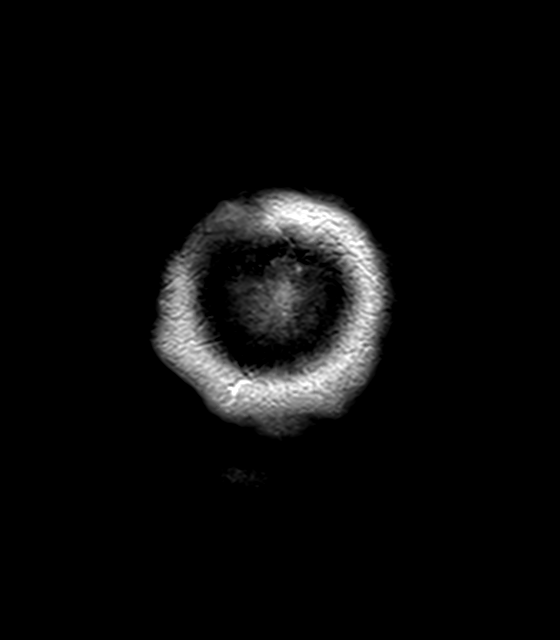

[Series 18: T1 post-contrast · sagittal · 5.0mm · 0.75mm/px · 2 of 23 slices shown (2 of 2)]
[im 1/23]
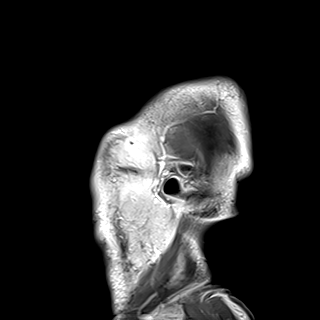
[im 23/23]
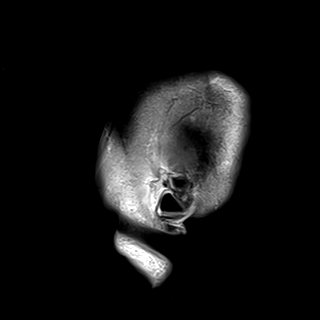

[38 of 48 positions shown; findings below may reference images not displayed]

FINDINGS: Brain: Generalized age-related cerebral atrophy again noted. Minimal
chronic microvascular ischemic changes present within the
periventricular deep white matter both cerebral hemispheres.

Gray-white matter differentiation maintained with no signal changes
to suggest diffuse cerebral anoxic brain injury. There is a punctate
6 mm focus of diffusion abnormality involving the cortical gray
matter of the anterior left frontal lobe, suspicious for a tiny
acute ischemic infarct (series 5, image 84). Additional probable
small 5 mm cortical infarct present at the left occipital lobe
(series 5, image 69). Inches suspected to be embolic in nature. No
other evidence for acute or subacute ischemia. No encephalomalacia
to suggest chronic infarction.

Previously identified small residual subdural hematoma overlying the
left parietooccipital convexity and extending along the left
tentorium again seen. This measures up to 6 mm in maximal thickness
without associated mass effect. No other evidence for acute or
chronic intracranial hemorrhage.

No mass lesion, midline shift or mass effect. No hydrocephalus.
Pituitary gland within normal limits. No abnormal enhancement within
the brain.

Vascular: Major intracranial vascular flow voids are maintained.

Skull and upper cervical spine: Craniocervical junction within
normal limits. Visualized upper cervical spine within normal limits.
Bone marrow signal intensity normal. No scalp soft tissue
abnormality.

Sinuses/Orbits: Globes and orbital soft tissues demonstrate no acute
finding. Patient status post ocular lens replacement bilaterally.
Paranasal sinuses are largely clear. Fluid seen layering within the
nasopharynx. Bilateral mastoid effusions noted. Patient likely
intubated.

Other: None.
IMPRESSION: 1. No MRI evidence for diffuse cerebral anoxic brain injury status
post recent CPR.
2. Two punctate foci of restricted diffusion involving the cortical
gray matter of the left frontal and occipital lobes as above,
suspicious for tiny acute ischemic infarcts. Findings suspected to
be embolic in nature.
3. Small residual left parieto-occipital subdural hematoma measuring
up to 6 mm in maximal thickness without significant mass effect.
4. Otherwise normal MRI for patient age.
